# Patient Record
Sex: Male | Born: 1970 | Race: Black or African American | Hispanic: No | Smoking: Current every day smoker
Health system: Southern US, Community
[De-identification: ages and names within clinical notes are randomized; demographics above are authoritative.]

## PROBLEM LIST (undated history)

## (undated) DIAGNOSIS — E669 Obesity, unspecified: Secondary | ICD-10-CM

## (undated) DIAGNOSIS — I10 Essential (primary) hypertension: Secondary | ICD-10-CM

## (undated) DIAGNOSIS — G4733 Obstructive sleep apnea (adult) (pediatric): Secondary | ICD-10-CM

## (undated) HISTORY — PX: THROAT SURGERY: SHX803

---

## 2011-05-31 LAB — COMPREHENSIVE METABOLIC PANEL
BUN: 16 mg/dL (ref 7–18)
Bilirubin,Total: 0.5 mg/dL (ref 0.2–1.0)
Chloride: 102 mmol/L (ref 98–107)
Glucose: 127 mg/dL — ABNORMAL HIGH (ref 65–99)
Osmolality: 278 (ref 275–301)
SGOT(AST): 76 U/L — ABNORMAL HIGH (ref 15–37)
Sodium: 138 mmol/L (ref 136–145)

## 2011-05-31 LAB — CBC
HCT: 46.7 % (ref 40.0–52.0)
HGB: 15.5 g/dL (ref 13.0–18.0)
MCH: 29.1 pg (ref 26.0–34.0)
MCHC: 33.2 g/dL (ref 32.0–36.0)
MCV: 88 fL (ref 80–100)
RBC: 5.34 10*6/uL (ref 4.40–5.90)
RDW: 13.5 % (ref 11.5–14.5)
WBC: 8.8 10*3/uL (ref 3.8–10.6)

## 2011-05-31 LAB — CK TOTAL AND CKMB (NOT AT ARMC): CK, Total: 985 U/L — ABNORMAL HIGH (ref 35–232)

## 2011-05-31 LAB — TROPONIN I: Troponin-I: <0.02 ng/mL

## 2011-06-01 ENCOUNTER — Observation Stay: Payer: Self-pay | Admitting: Internal Medicine

## 2011-06-01 LAB — DRUG SCREEN, URINE
Amphetamines, Ur Screen: NEGATIVE (ref ?–1000)
Barbiturates, Ur Screen: NEGATIVE (ref ?–200)
Cannabinoid 50 Ng, Ur ~~LOC~~: NEGATIVE (ref ?–50)
Cocaine Metabolite,Ur ~~LOC~~: NEGATIVE (ref ?–300)
MDMA (Ecstasy)Ur Screen: NEGATIVE (ref ?–500)
Opiate, Ur Screen: NEGATIVE (ref ?–300)
Tricyclic, Ur Screen: NEGATIVE (ref ?–1000)

## 2011-06-01 LAB — APTT
Activated PTT: 25.4 secs (ref 23.6–35.9)
Activated PTT: 93.2 secs — ABNORMAL HIGH (ref 23.6–35.9)

## 2011-06-01 LAB — LIPASE, BLOOD: Lipase: 170 U/L (ref 73–393)

## 2011-06-01 LAB — TROPONIN I: Troponin-I: <0.02 ng/mL

## 2011-06-01 LAB — ETHANOL
Ethanol %: 0.074 % (ref 0.000–0.080)
Ethanol: 74 mg/dL

## 2011-06-01 LAB — CK TOTAL AND CKMB (NOT AT ARMC)
CK, Total: 676 U/L — ABNORMAL HIGH
CK-MB: 5 ng/mL — ABNORMAL HIGH (ref 0.5–3.6)
CK-MB: 4.4 ng/mL — ABNORMAL HIGH

## 2011-06-02 LAB — BASIC METABOLIC PANEL
Anion Gap: 7 (ref 7–16)
BUN: 16 mg/dL (ref 7–18)
Calcium, Total: 8.5 mg/dL (ref 8.5–10.1)
Chloride: 104 mmol/L (ref 98–107)
Creatinine: 1.17 mg/dL (ref 0.60–1.30)
EGFR (African American): >60
EGFR (Non-African Amer.): >60
Osmolality: 282 (ref 275–301)
Sodium: 140 mmol/L (ref 136–145)

## 2011-06-02 LAB — MAGNESIUM: Magnesium: 2.1 mg/dL

## 2011-06-02 LAB — LIPID PANEL
Cholesterol: 202 mg/dL — ABNORMAL HIGH (ref 0–200)
Triglycerides: 530 mg/dL — ABNORMAL HIGH (ref 0–200)

## 2011-06-02 LAB — HEMOGLOBIN A1C: Hemoglobin A1C: 7 % — ABNORMAL HIGH (ref 4.2–6.3)

## 2011-06-02 LAB — PROTIME-INR: INR: 0.9

## 2014-04-17 DIAGNOSIS — I10 Essential (primary) hypertension: Secondary | ICD-10-CM | POA: Insufficient documentation

## 2014-05-10 NOTE — H&P (Signed)
PATIENT NAME:  Ernest PineRAMSEY, Jerad D MR#:  [id number] DATE OF BIRTH:  September 10, 1970  DATE OF ADMISSION:  06/01/2011  REFERRING PHYSICIAN: Dr. Jens SomWiegand PRIMARY CARE PHYSICIAN: Karene FryJason Sonnenschein, PA at [facility]  PRESENTING COMPLAINT: Epigastric pain followed by syncope x2.   HISTORY OF PRESENT ILLNESS: Mr. Reed PandyRamsey is a 44 year old gentleman with history of obesity, hypertension, tobacco abuse who presents today with reports of developing syncope and collapse witnessed by his wife. Reports that he was walking into his house with the first episode when he developed epigastric pain and sensation of discomfort up his chest. He became diaphoretic and syncopized for maybe 10 seconds. Patient came to, he ambulated to his bedroom, had another spell of epigastric pain and syncope also lasting 10 seconds. No convulsions, no bowel or urine incontinence, no confusion when he came to. No prior episodes in the past. Reports that he has been having intermittent coughing spells for about two years now with intermittent productive phlegm, however, no hemoptysis. He denies any chest pain. He actually does not describe the epigastric pain as chest discomfort. He denies any rash or sores, fevers or chills, nausea, or vomiting. Currently denies any chest pain still and no recurrent episodes of syncope since being here.   PAST MEDICAL HISTORY:  1. Hypertension.  2. Obesity.  3. Tobacco abuse.   PAST SURGICAL HISTORY: Benign neck mass resection from his submandibular space it appears.   ALLERGIES: No known drug allergies.   MEDICATIONS:  1. Hydrochlorothiazide 25 mg daily.  2. Lisinopril/HCTZ 20/25 mg daily.  3. Amlodipine 10 mg daily.  4. Metoprolol ER 100 mg daily.   FAMILY HISTORY: Mother had open heart surgery in her mid 5540s. Grandmother had diabetes.   SOCIAL HISTORY: [place] in [place] with his wife and two kids. He smokes a pack per day. He drinks maybe two beers every other night. No history of drug  use.   REVIEW OF SYSTEMS: CONSTITUTIONAL: No fevers, nausea, vomiting. EYES: No visual changes. ENT: No epistaxis, discharge. RESPIRATORY: As per history of present illness with chronic cough. No wheezing or hemoptysis. CARDIOVASCULAR: As per history of present illness. Denies any lower extremity edema, palpitations, or arrhythmia. GASTROINTESTINAL: No nausea, vomiting, diarrhea. Reports epigastric pain as per history of present illness. No hematemesis or melena. GENITOURINARY: No dysuria, hematuria. ENDO: No polyuria, polydipsia. HEMATOLOGIC: No easy bleeding. SKIN: No ulcers. MUSCULOSKELETAL: No joint pain or swelling. NEUROLOGIC: No history of strokes or seizures. PSYCH: Denies any depression or suicidal ideation.   PHYSICAL EXAMINATION:  VITAL SIGNS: Temperature 98.6, pulse 92, respiratory rate 20, blood pressure 105/55, sating at 99% on room air.   GENERAL: Lying in bed, obese in no apparent distress.   HEENT: Normocephalic, atraumatic. Pupils equal, symmetric, nonicteric. Nares without discharge. Moist mucous membrane.   NECK: Soft and supple. No adenopathy or JVP.   CARDIOVASCULAR: Non-tachy. No murmurs, rubs, or gallops.   LUNGS: Clear to auscultation bilaterally. No use of accessory muscles or increased respiratory effort.   ABDOMEN: Soft. Positive bowel sounds. No mass appreciated. No pain in the epigastric region on palpation.   EXTREMITIES: No edema. Dorsal pedis pulses intact.   MUSCULOSKELETAL: No joint effusion.   SKIN: No ulcers.   NEUROLOGIC: No dysarthria, aphasia. Symmetrical strength. No focal deficits.   PSYCH: He is alert and oriented. Patient is cooperative.   LABORATORY, DIAGNOSTIC AND RADIOLOGICAL DATA: CK 985, MB 5.3. Troponin less than 0.02. Glucose 127, BUN 16, creatinine 1.34, sodium 138, potassium 3.2, chloride 102, carbon  dioxide 23, calcium 8.5, total bilirubin 0.5, alkaline phosphatase 53, ALT 59, AST 76, total protein 7.4, albumin 3.6, WBC 8.8,  hemoglobin 15.5, hematocrit 46.7, platelet 220, MCV 88. Chest x-ray without any acute findings. EKG with sinus rate of 92. There is T wave inversion in 1, 2, AVL leads also V4 to V6.   ASSESSMENT AND PLAN: Mr. Fotheringham is a 44 year old gentleman with history of tobacco use, hypertension, obesity, family history of early cardiac disease presenting status post epigastric pain and syncope x2. 1. Syncope. Questionable cardiac etiology. CK and MB elevated. The first troponin is negative. An EKG with T wave inversions in inferolateral lead. Continue on tele. Cycle cardiac enzymes. Will start on nitroglycerin sublingual as needed. Resume his metoprolol XL. Start on Lipitor, aspirin, oxygen and morphine as needed. Obtain orthostatics daily. Will order for a Myoview and an echo for now. Will obtain cardiology consultation but given his story and history questionable benefits from pursuing catheterization instead.   2. Epigastric pain. Could be anginal equivalent. His AST elevation is likely in the setting of alcohol use. Will send EtOH level and urine drug screen. Will also get abdominal ultrasound as the abdominal pain could be a primary issue that caused a vagal response.  3. Renal insufficiency. Hold hydrochlorothiazide and lisinopril. Continue IV fluids. Follow daily creatinine.  4. Tobacco abuse. Encouraged smoking cessation and start on nicotine patch.  5. Hypertension with relative low normal pressures. Resume beta blocker, but hold lisinopril, HCTZ and amlodipine for now. 6. Hypokalemia. As above, holding HCTZ.  7. Prophylaxis with heparin sub-Q, aspirin and Protonix.   TIME SPENT: Approximately 50 minutes on patient care.  ____________________________ Reuel Derby, MD ap:cms D: 06/01/2011 04:21:43 ET T: 06/01/2011 09:33:30 ET  JOB#: [id number] cc: Karene Fry, PA at [facility] MD ELECTRONICALLY SIGNED 06/14/2011 22:36

## 2014-05-10 NOTE — Consult Note (Signed)
Brief Consult Note: Diagnosis: Syncope, probable neurocardiogenic.   Patient was seen by consultant.   Consult note dictated.   Comments: REC  Agree with current therapy, review echo, ETT-sest in am.  Electronic Signatures: Marcina MillardParaschos, Adreana Coull (MD)  (Signed 16-May-13 16:09)  Authored: Brief Consult Note   Last Updated: 16-May-13 16:09 by Marcina MillardParaschos, Tishina Lown (MD)

## 2014-05-10 NOTE — Discharge Summary (Signed)
PATIENT NAME:  Rodney Hess, [name] MR#:  [id number] DATE OF BIRTH:  Nov 01, 1970  DATE OF ADMISSION:  06/01/2011 DATE OF DISCHARGE:  06/02/2011  ADMITTING DIAGNOSIS: Epigastric pain followed by syncope with complaints of lower chest pain.   DISCHARGE DIAGNOSES:  1. Syncope, possibly vasovagal in nature.  2. Acute renal failure, resolved with intravenous hydration.  3. Mild rhabdomyolysis, likely as a result of possible fall from his syncope. His CPK was trending downwards.  4. Vague chest pain, felt to be atypical, status post stress test which showed no evidence of ischemia or infarct. Left ventricular function by gated acquisition 43. No evidence of reversible ischemia. Carotid Doppler's showed no significant stenosis. EKG showed normal sinus rhythm, nonspecific T wave changes. Chest x-ray on presentation was no acute abnormality. Hemoglobin A1c was 7.  5. Hypertriglyceridemia. Cholesterol 202, triglycerides 530, HDL 22. TSH 1.98.   HOSPITAL COURSE: Please see history and physical done by the admitting physician. The patient is a 44 year old African American male with hypertension and tobacco abuse who presented with complaint of passing out x2 episodes. He stated that he had some epigastric pain as well as some discomfort of his chest. He became diaphoretic and passed out for 10 seconds. Due to the patient having two of these episodes, we were asked to admit the patient. The patient in the ED was noted to be a little dehydrated. He was admitted and underwent further evaluation for his syncope. The patient had no evidence of any arrhythmia overnight on telemetry. His carotid Doppler's were negative. An echocardiogram report is currently pending. He also underwent stress test because of this chest pain. The stress test was negative. He was noted to have hemoglobin A1c suggestive of diabetes. The patient counseled regarding diabetes, how to check his sugars, etc. He will be discharged on metformin. He was  also noted to have hypertriglyceridemia, which will be treated. At this time, he is feeling much better and is stable for discharge.   DISCHARGE MEDICATIONS:  1. Metoprolol succinate 100 mg daily.  2. Amlodipine 10 daily.  3. Lisinopril 20 daily.  4. Aspirin 81, one tab daily. 5. Metformin 500 mg p.o. b.i.d.  6. TriCor 145 p.o. daily.   HOME O2: None.   DIET: Low sodium, low fat.   TIMEFRAME FOR FOLLOW UP: 1 to 2 weeks with Karene FryJason Sonnenschein, his primary care provider. The patient is to keep a log of blood sugar to take to his primary M.D. I also recommended he exercise and follow the diet as given to him.   TIME SPENT: 35 minutes.     ____________________________ Lacie ScottsShreyang H. Allena KatzPatel, MD shp:ap D: 06/02/2011 16:31:38 ET T: 06/03/2011 15:00:33 ET JOB#: [id number]  cc: Jhan Conery H. Allena KatzPatel, MD, <Dictator> Karene FryJason Sonnenschein, [place]  Serita GritSHREYANG Molinda BailiffH Lateisha Thurlow MD ELECTRONICALLY SIGNED 06/10/2011 19:08

## 2014-05-10 NOTE — Consult Note (Signed)
PATIENT NAME:  Rodney Hess, [name] MR#:  [id number] DATE OF BIRTH:  [birth year]  DATE OF CONSULTATION:  06/01/2011  REFERRING PHYSICIAN:  Alounthith Phichith, MD CONSULTING PHYSICIAN:  Marcina MillardAlexander Annleigh Knueppel, MD  CHIEF COMPLAINT:  I passed out.   HISTORY OF PRESENT ILLNESS: The patient is a 44 year old male referred for evaluation of syncope. The patient has a history of hypertension. He reports that he was in his usual state of health until 06/01/2011 when he developed a flushing sensation followed by mid epigastric discomfort and had a brief loss of consciousness in his front yard witnessed by his wife. [facility] was [facility] where EKG revealed sinus rhythm with T wave inversion inferolaterally. The patient denies chest pain or shortness of breath. The patient has ruled out for myocardial infarction by troponin.   PAST MEDICAL HISTORY:  1. Hypertension.  2. Obesity.  3. Tobacco abuse.   MEDICATIONS:  1. Metoprolol ER 100 mg daily.  2. Amlodipine 10 mg daily.  3. Lisinopril/HCTZ 20/25 daily.  4. HCTZ 25 mg daily.   SOCIAL HISTORY: The patient is married. He lives with his wife. He smokes a pack of cigarettes a day. He drinks approximately two beers every other night.   FAMILY HISTORY: Positive for coronary artery disease. Mother is status post bypass graft surgery in her 3940s.   REVIEW OF SYSTEMS: CONSTITUTIONAL: No fever or chills. EYES: No vision changes. EARS: No hearing loss. RESPIRATORY: No shortness of breath. CARDIOVASCULAR: No chest pain. GASTROINTESTINAL: Nausea as described yesterday. GU: No dysuria or hematuria. ENDOCRINE: No polyuria or polydipsia. HEMATOLOGICAL: No easy bruising or bleeding. MUSCULOSKELETAL: No arthralgias or myalgias. NEUROLOGICAL: No focal muscle weakness or numbness.   PSYCHOLOGICAL: No depression or anxiety.   PHYSICAL EXAMINATION:  VITAL SIGNS: Blood pressure 153/99, pulse 69, respirations 20, temperature 98.4, pulse  oximetry 93%.   HEENT: Pupils equal and reactive to light and accommodation.   NECK: Supple without thyromegaly.   LUNGS: Clear.   HEART: Normal jugular venous pressure. Normal point of maximal impulse. Regular rate and rhythm. Normal S1, S2. No appreciable gallop, murmur, or rub.   ABDOMEN: Soft and nontender. Pulses were intact bilaterally.   MUSCULOSKELETAL: Normal muscle tone.   NEUROLOGIC: The patient is alert and oriented x3. Motor and sensory both grossly intact.   IMPRESSION: A 44 year old gentleman who presents after a syncopal episode with features consistent with vasovagal syncope with mid epigastric discomfort, diaphoresis and brief loss of consciousness. EKG is abnormal at baseline likely reflective of hypertension. The patient has ruled out for myocardial infarction by troponin.   RECOMMENDATIONS:  1. Agree with overall current therapy.  2. Review 2-D echocardiogram.  3. Reasonable to proceed with functional study.  4. If no evidence for ischemia, no further work-up required at this time. ____________________________ Marcina MillardAlexander Takirah Binford, MD ap:ap D: 06/01/2011 16:08:23 ET            T: 06/02/2011 07:58:19 ET                   JOB#: [id number] cc: Marcina MillardAlexander Anu Stagner, MD, <Dictator> Marcina MillardALEXANDER Xavyer Steenson MD ELECTRONICALLY SIGNED 06/29/2011 17:39

## 2014-10-03 ENCOUNTER — Inpatient Hospital Stay
Admission: EM | Admit: 2014-10-03 | Discharge: 2014-10-04 | DRG: 153 | Disposition: A | Discharge status: Home / Self Care | Payer: BLUE CROSS/BLUE SHIELD | Attending: Internal Medicine | Admitting: Internal Medicine

## 2014-10-03 ENCOUNTER — Emergency Department: Payer: BLUE CROSS/BLUE SHIELD

## 2014-10-03 ENCOUNTER — Encounter: Payer: Self-pay | Admitting: Emergency Medicine

## 2014-10-03 ENCOUNTER — Other Ambulatory Visit: Payer: Self-pay

## 2014-10-03 DIAGNOSIS — Z6841 Body Mass Index (BMI) 40.0 and over, adult: Secondary | ICD-10-CM

## 2014-10-03 DIAGNOSIS — M6282 Rhabdomyolysis: Secondary | ICD-10-CM | POA: Diagnosis present

## 2014-10-03 DIAGNOSIS — E860 Dehydration: Secondary | ICD-10-CM

## 2014-10-03 DIAGNOSIS — G4733 Obstructive sleep apnea (adult) (pediatric): Secondary | ICD-10-CM | POA: Diagnosis present

## 2014-10-03 DIAGNOSIS — J029 Acute pharyngitis, unspecified: Secondary | ICD-10-CM | POA: Diagnosis present

## 2014-10-03 DIAGNOSIS — N179 Acute kidney failure, unspecified: Secondary | ICD-10-CM

## 2014-10-03 DIAGNOSIS — I10 Essential (primary) hypertension: Secondary | ICD-10-CM | POA: Diagnosis present

## 2014-10-03 DIAGNOSIS — R1310 Dysphagia, unspecified: Secondary | ICD-10-CM | POA: Diagnosis present

## 2014-10-03 DIAGNOSIS — F17210 Nicotine dependence, cigarettes, uncomplicated: Secondary | ICD-10-CM | POA: Diagnosis present

## 2014-10-03 HISTORY — DX: Obstructive sleep apnea (adult) (pediatric): G47.33

## 2014-10-03 HISTORY — DX: Essential (primary) hypertension: I10

## 2014-10-03 HISTORY — DX: Obesity, unspecified: E66.9

## 2014-10-03 LAB — CBC WITH DIFFERENTIAL/PLATELET
BASOS ABS: 0.2 10*3/uL — AB (ref 0–0.1)
Basophils Relative: 2 %
EOS ABS: 0.1 10*3/uL (ref 0–0.7)
EOS PCT: 1 %
HCT: 44.1 % (ref 40.0–52.0)
HEMOGLOBIN: 14.4 g/dL (ref 13.0–18.0)
LYMPHS ABS: 0.5 10*3/uL — AB (ref 1.0–3.6)
LYMPHS PCT: 6 %
MCH: 27.9 pg (ref 26.0–34.0)
MCHC: 32.6 g/dL (ref 32.0–36.0)
MCV: 85.4 fL (ref 80.0–100.0)
Monocytes Absolute: 0.8 10*3/uL (ref 0.2–1.0)
Monocytes Relative: 9 %
NEUTROS PCT: 82 %
Neutro Abs: 7.1 10*3/uL — ABNORMAL HIGH (ref 1.4–6.5)
PLATELETS: 186 10*3/uL (ref 150–440)
RBC: 5.17 MIL/uL (ref 4.40–5.90)
RDW: 13.9 % (ref 11.5–14.5)
WBC: 8.8 10*3/uL (ref 3.8–10.6)

## 2014-10-03 LAB — URINALYSIS COMPLETE WITH MICROSCOPIC (ARMC ONLY)
BILIRUBIN URINE: NEGATIVE
Bacteria, UA: NONE SEEN
GLUCOSE, UA: NEGATIVE mg/dL
KETONES UR: NEGATIVE mg/dL
LEUKOCYTES UA: NEGATIVE
NITRITE: NEGATIVE
Protein, ur: NEGATIVE mg/dL
SPECIFIC GRAVITY, URINE: 1.042 — AB (ref 1.005–1.030)
Squamous Epithelial / LPF: NONE SEEN
pH: 5 (ref 5.0–8.0)

## 2014-10-03 LAB — COMPREHENSIVE METABOLIC PANEL
ALT: 38 U/L (ref 17–63)
AST: 47 U/L — AB (ref 15–41)
Albumin: 3.9 g/dL (ref 3.5–5.0)
Alkaline Phosphatase: 46 U/L (ref 38–126)
Anion gap: 10 (ref 5–15)
BILIRUBIN TOTAL: 0.8 mg/dL (ref 0.3–1.2)
BUN: 18 mg/dL (ref 6–20)
CHLORIDE: 100 mmol/L — AB (ref 101–111)
CO2: 27 mmol/L (ref 22–32)
Calcium: 8.8 mg/dL — ABNORMAL LOW (ref 8.9–10.3)
Creatinine, Ser: 1.68 mg/dL — ABNORMAL HIGH (ref 0.61–1.24)
GFR, EST AFRICAN AMERICAN: 56 mL/min — AB (ref >60)
GFR, EST NON AFRICAN AMERICAN: 48 mL/min — AB (ref >60)
Glucose, Bld: 221 mg/dL — ABNORMAL HIGH (ref 65–99)
POTASSIUM: 3.7 mmol/L (ref 3.5–5.1)
Sodium: 137 mmol/L (ref 135–145)
TOTAL PROTEIN: 7.2 g/dL (ref 6.5–8.1)

## 2014-10-03 LAB — CK: Total CK: 424 U/L — ABNORMAL HIGH (ref 49–397)

## 2014-10-03 LAB — BRAIN NATRIURETIC PEPTIDE: B Natriuretic Peptide: 260 pg/mL — ABNORMAL HIGH (ref 0.0–100.0)

## 2014-10-03 LAB — TROPONIN I
TROPONIN I: 0.05 ng/mL — AB (ref <0.031)
Troponin I: 0.05 ng/mL — ABNORMAL HIGH (ref <0.031)
Troponin I: 0.04 ng/mL — ABNORMAL HIGH (ref <0.031)

## 2014-10-03 MED ORDER — SODIUM CHLORIDE 0.9 % IV BOLUS (SEPSIS)
1000.0000 mL | Freq: Once | INTRAVENOUS | Status: AC
  Administered 2014-10-03: 1000 mL via INTRAVENOUS

## 2014-10-03 MED ORDER — SODIUM CHLORIDE 0.9 % IJ SOLN
3.0000 mL | Freq: Two times a day (BID) | INTRAMUSCULAR | Status: DC
  Administered 2014-10-03: 3 mL via INTRAVENOUS

## 2014-10-03 MED ORDER — IOHEXOL 300 MG/ML  SOLN
75.0000 mL | Freq: Once | INTRAMUSCULAR | Status: AC | PRN
  Administered 2014-10-03: 75 mL via INTRAVENOUS

## 2014-10-03 MED ORDER — HYDRALAZINE HCL 20 MG/ML IJ SOLN: 10.0000 mg | Freq: Four times a day (QID) | INTRAMUSCULAR | Status: DC | PRN

## 2014-10-03 MED ORDER — DEXAMETHASONE 4 MG PO TABS
4.0000 mg | ORAL_TABLET | Freq: Three times a day (TID) | ORAL | Status: DC
  Administered 2014-10-03 – 2014-10-04 (×2): 4 mg via ORAL
  Filled 2014-10-03 (×5): qty 1

## 2014-10-03 MED ORDER — OXYCODONE HCL 5 MG PO TABS
5.0000 mg | ORAL_TABLET | ORAL | Status: DC | PRN
  Administered 2014-10-03: 5 mg via ORAL
  Filled 2014-10-03: qty 1

## 2014-10-03 MED ORDER — AMLODIPINE BESYLATE 10 MG PO TABS
10.0000 mg | ORAL_TABLET | Freq: Every day | ORAL | Status: DC
  Administered 2014-10-03 – 2014-10-04 (×2): 10 mg via ORAL
  Filled 2014-10-03: qty 1
  Filled 2014-10-03: qty 2

## 2014-10-03 MED ORDER — ACETAMINOPHEN 325 MG PO TABS: 650.0000 mg | ORAL_TABLET | Freq: Four times a day (QID) | ORAL | Status: DC | PRN

## 2014-10-03 MED ORDER — SODIUM CHLORIDE 0.9 % IV SOLN
3.0000 g | Freq: Four times a day (QID) | INTRAVENOUS | Status: DC
  Administered 2014-10-03 – 2014-10-04 (×3): 3 g via INTRAVENOUS
  Filled 2014-10-03 (×7): qty 3

## 2014-10-03 MED ORDER — INFLUENZA VAC SPLIT QUAD 0.5 ML IM SUSY: 0.5000 mL | PREFILLED_SYRINGE | INTRAMUSCULAR | Status: DC

## 2014-10-03 MED ORDER — CLINDAMYCIN PHOSPHATE 600 MG/50ML IV SOLN
600.0000 mg | Freq: Once | INTRAVENOUS | Status: AC
  Administered 2014-10-03: 600 mg via INTRAVENOUS
  Filled 2014-10-03: qty 50

## 2014-10-03 MED ORDER — ASPIRIN 81 MG PO CHEW
324.0000 mg | CHEWABLE_TABLET | Freq: Once | ORAL | Status: AC
  Administered 2014-10-03: 324 mg via ORAL
  Filled 2014-10-03: qty 4

## 2014-10-03 MED ORDER — ENOXAPARIN SODIUM 40 MG/0.4ML ~~LOC~~ SOLN
40.0000 mg | SUBCUTANEOUS | Status: DC
  Administered 2014-10-03: 40 mg via SUBCUTANEOUS
  Filled 2014-10-03: qty 0.4

## 2014-10-03 MED ORDER — DEXAMETHASONE SODIUM PHOSPHATE 10 MG/ML IJ SOLN
10.0000 mg | Freq: Once | INTRAMUSCULAR | Status: AC
  Administered 2014-10-03: 10 mg via INTRAVENOUS
  Filled 2014-10-03: qty 1

## 2014-10-03 MED ORDER — SODIUM CHLORIDE 0.9 % IV SOLN
INTRAVENOUS | Status: DC
  Administered 2014-10-03: 21:00:00 via INTRAVENOUS

## 2014-10-03 MED ORDER — ONDANSETRON HCL 4 MG PO TABS: 4.0000 mg | ORAL_TABLET | Freq: Four times a day (QID) | ORAL | Status: DC | PRN

## 2014-10-03 MED ORDER — ACETAMINOPHEN 650 MG RE SUPP: 650.0000 mg | Freq: Four times a day (QID) | RECTAL | Status: DC | PRN

## 2014-10-03 MED ORDER — ONDANSETRON HCL 4 MG/2ML IJ SOLN
4.0000 mg | Freq: Four times a day (QID) | INTRAMUSCULAR | Status: DC | PRN
  Administered 2014-10-03: 4 mg via INTRAVENOUS
  Filled 2014-10-03: qty 2

## 2014-10-03 NOTE — ED Notes (Signed)
Pt states his muscles in his arms/legs feel like he has been lifting weights, tingling since he woke up yesterday morning, has been able to walk. C/o sore throat, states its hard to swallow. Pt is diaphoretic. Sats 89-91% on room air, denies asthma/allergies/copd.

## 2014-10-03 NOTE — Consult Note (Signed)
Rodney Hess, Rodney Hess [id number] 07-02-1970 Rodney Nearing, MD  Reason for Consult: Sore throat Requesting Physician: Gladstone Lighter, MD Consulting Physician: Rodney Hess  HPI: This 44 y.o. year old male was admitted on 10/03/2014 for body tingling,sweating. For the past 24 hours or so he has had some low-grade fever, sore throat, nasal congestion, postnasal drainage, and some difficulty swallowing. He points to the level of his larynx were the throat was irritated. He also was having body aches and there is concern for rhabdomyolysis. He has not had any neck pain or stiffness. He was given clindamycin in the emergency room and now he says his throat feels much better, he is hungry and wants to eat. He denies any hoarseness or difficulty breathing.  Allergies: No Known Allergies  Medications:  (Not in a hospital admission). Current Facility-Administered Medications  Medication Dose Route Frequency Provider Last Rate Last Dose  . amLODipine (NORVASC) tablet 10 mg  10 mg Oral Daily Gladstone Lighter, MD   10 mg at 10/03/14 1901  . hydrALAZINE (APRESOLINE) injection 10 mg  10 mg Intravenous Q6H PRN Gladstone Lighter, MD       Current Outpatient Prescriptions  Medication Sig Dispense Refill  . amLODipine (NORVASC) 5 MG tablet Take 5 mg by mouth daily.    . hydrochlorothiazide (HYDRODIURIL) 12.5 MG tablet Take 12.5 mg by mouth every morning.      PMH:  Past Medical History  Diagnosis Date  . Hypertension   . OSA (obstructive sleep apnea)   . Obesity     Fam Hx:  Family History  Problem Relation Age of Onset  . CAD Mother     Soc Hx:  Social History   Social History  . Marital Status: Married    Spouse Name: N/A  . Number of Children: N/A  . Years of Education: N/A   Occupational History  . Not on file.   Social History Main Topics  . Smoking status: Current Every Day Smoker -- 1.00 packs/day    Types: Cigarettes    Last Attempt to Quit: 10/01/2014  . Smokeless  tobacco: Not on file  . Alcohol Use: 0.0 oz/week    0 Standard drinks or equivalent per week     Comment: daily, mostly beer  . Drug Use: No  . Sexual Activity: Not on file   Other Topics Concern  . Not on file   Social History Narrative   Lives at home with family.    PSH:  Past Surgical History  Procedure Laterality Date  . Throat surgery    . Procedures since admission: No admission procedures for hospital encounter.  ROS: Review of systems normal other than 12 systems except per HPI.  PHYSICAL EXAM Vitals:  Filed Vitals:   10/03/14 1900  BP: 155/87  Pulse:   Temp:   Resp: 23  . General: Well-developed, Well-nourished obese male, in no acute distress Mood: Mood and affect well adjusted, pleasant and cooperative. Orientation: Grossly alert and oriented. Vocal Quality: No hoarseness. Communicates verbally. No stridor. head and Face: NCAT. No facial asymmetry. No visible skin lesions. No significant facial scars. No tenderness with sinus percussion. Facial strength normal and symmetric. Ears: External ears with normal landmarks, no lesions. External auditory canals free of infection, cerumen impaction or lesions. Tympanic membranes intact with good landmarks and normal mobility on pneumatic otoscopy. No middle ear effusion. Hearing: Speech reception grossly normal. Nose: External nose normal with midline dorsum and no lesions or deformity. Nasal Cavity reveals essentially midline  septum with some mucosal edema of the turbinates, mild erythema and clear secretions. No gross purulence is seen. No polyps seen on anterior rhinoscopy. Oral Cavity/ Oropharynx: Lips are normal with no lesions. Teeth no frank dental caries. Gingiva healthy with no lesions or gingivitis. Oropharynx including tongue, buccal mucosa, floor of mouth, hard and soft palate, uvula and posterior pharynx are normal except for some mild nonspecific erythema of the posterior pharynx without evidence of abscess or  any mass effect in the posterior pharyngeal wall.  Indirect Laryngoscopy/Nasopharyngoscopy: Visualization of the larynx, hypopharynx and nasopharynx is not possible in this setting with routine examination. Neck: Supple and symmetric with no palpable masses, tenderness or crepitance. There is no neck stiffness at all. The trachea is midline. Thyroid gland is soft, nontender and symmetric with no masses or enlargement. Parotid and submandibular glands are soft, nontender and symmetric, without masses. Lymphatic: Cervical lymph nodes are without palpable lymphadenopathy or tenderness. Respiratory: Normal respiratory effort without labored breathing. Cardiovascular: Carotid pulse shows regular rate and rhythm Neurologic: Cranial Nerves II through XII are grossly intact. Eyes: Gaze and Ocular Motility are grossly normal. PERRLA. No visible nystagmus.  MEDICAL DECISION MAKING: Data Review:  Results for orders placed or performed during the hospital encounter of 10/03/14 (from the past 48 hour(s))  Comprehensive metabolic panel     Status: Abnormal   Collection Time: 10/03/14  2:35 PM  Result Value Ref Range   Sodium 137 135 - 145 mmol/L   Potassium 3.7 3.5 - 5.1 mmol/L   Chloride 100 (L) 101 - 111 mmol/L   CO2 27 22 - 32 mmol/L   Glucose, Bld 221 (H) 65 - 99 mg/dL   BUN 18 6 - 20 mg/dL   Creatinine, Ser 1.68 (H) 0.61 - 1.24 mg/dL   Calcium 8.8 (L) 8.9 - 10.3 mg/dL   Total Protein 7.2 6.5 - 8.1 g/dL   Albumin 3.9 3.5 - 5.0 g/dL   AST 47 (H) 15 - 41 U/L   ALT 38 17 - 63 U/L   Alkaline Phosphatase 46 38 - 126 U/L   Total Bilirubin 0.8 0.3 - 1.2 mg/dL   GFR calc non Af Amer 48 (L) >60 mL/min   GFR calc Af Amer 56 (L) >60 mL/min    Comment: (NOTE) The eGFR has been calculated using the CKD EPI equation. This calculation has not been validated in all clinical situations. eGFR's persistently <60 mL/min signify possible Chronic Kidney Disease.    Anion gap 10 5 - 15  CBC WITH DIFFERENTIAL      Status: Abnormal   Collection Time: 10/03/14  2:35 PM  Result Value Ref Range   WBC 8.8 3.8 - 10.6 K/uL   RBC 5.17 4.40 - 5.90 MIL/uL   Hemoglobin 14.4 13.0 - 18.0 g/dL   HCT 44.1 40.0 - 52.0 %   MCV 85.4 80.0 - 100.0 fL   MCH 27.9 26.0 - 34.0 pg   MCHC 32.6 32.0 - 36.0 g/dL   RDW 13.9 11.5 - 14.5 %   Platelets 186 150 - 440 K/uL   Neutrophils Relative % 82 %   Neutro Abs 7.1 (H) 1.4 - 6.5 K/uL   Lymphocytes Relative 6 %   Lymphs Abs 0.5 (L) 1.0 - 3.6 K/uL   Monocytes Relative 9 %   Monocytes Absolute 0.8 0.2 - 1.0 K/uL   Eosinophils Relative 1 %   Eosinophils Absolute 0.1 0 - 0.7 K/uL   Basophils Relative 2 %   Basophils Absolute 0.2 (  H) 0 - 0.1 K/uL  Troponin I     Status: Abnormal   Collection Time: 10/03/14  2:35 PM  Result Value Ref Range   Troponin I 0.05 (H) <0.031 ng/mL    Comment: READ BACK AND VERIFIED WITH JANE RYAN @ 1526 ON 10/03/2014 BY [facility]        PERSISTENTLY INCREASED TROPONIN VALUES IN THE RANGE OF 0.04-0.49 ng/mL CAN BE SEEN IN:       -UNSTABLE ANGINA       -CONGESTIVE HEART FAILURE       -MYOCARDITIS       -CHEST TRAUMA       -ARRYHTHMIAS       -LATE PRESENTING MYOCARDIAL INFARCTION       -COPD   CLINICAL FOLLOW-UP RECOMMENDED.   CK     Status: Abnormal   Collection Time: 10/03/14  2:35 PM  Result Value Ref Range   Total CK 424 (H) 49 - 397 U/L  Urinalysis complete, with microscopic ([facility] only)     Status: Abnormal   Collection Time: 10/03/14  4:48 PM  Result Value Ref Range   Color, Urine YELLOW (A) YELLOW   APPearance CLEAR (A) CLEAR   Glucose, UA NEGATIVE NEGATIVE mg/dL   Bilirubin Urine NEGATIVE NEGATIVE   Ketones, ur NEGATIVE NEGATIVE mg/dL   Specific Gravity, Urine 1.042 (H) 1.005 - 1.030   Hgb urine dipstick 1+ (A) NEGATIVE   pH 5.0 5.0 - 8.0   Protein, ur NEGATIVE NEGATIVE mg/dL   Nitrite NEGATIVE NEGATIVE   Leukocytes, UA NEGATIVE NEGATIVE   RBC / HPF 0-5 0 - 5 RBC/hpf   WBC, UA 0-5 0 - 5 WBC/hpf   Bacteria, UA NONE SEEN NONE  SEEN   Squamous Epithelial / LPF NONE SEEN NONE SEEN   Mucous PRESENT    Hyaline Casts, UA PRESENT   Brain natriuretic peptide     Status: Abnormal   Collection Time: 10/03/14  4:48 PM  Result Value Ref Range   B Natriuretic Peptide 260.0 (H) 0.0 - 100.0 pg/mL  Troponin I     Status: Abnormal   Collection Time: 10/03/14  4:48 PM  Result Value Ref Range   Troponin I 0.05 (H) <0.031 ng/mL    Comment: RESULTS PREVIOUSLY CALLED BY [facility] ON 10/03/2014 @ 1526 [facility]        PERSISTENTLY INCREASED TROPONIN VALUES IN THE RANGE OF 0.04-0.49 ng/mL CAN BE SEEN IN:       -UNSTABLE ANGINA       -CONGESTIVE HEART FAILURE       -MYOCARDITIS       -CHEST TRAUMA       -ARRYHTHMIAS       -LATE PRESENTING MYOCARDIAL INFARCTION       -COPD   CLINICAL FOLLOW-UP RECOMMENDED.   . Dg Chest 2 View  10/03/2014   CLINICAL DATA:  Smoker. Pt states his muscles in his arms/legs feel like he has been lifting weights, tingling since he woke up yesterday morning, has been able to walk. C/o sore throat, states its hard to swallow. Pt is diaphoretic. Sats 89-91%  EXAM: CHEST  2 VIEW  COMPARISON:  05/31/2011  FINDINGS: The heart size and mediastinal contours are within normal limits. Both lungs are clear. No pleural effusion or pneumothorax. Bony thorax is intact.  IMPRESSION: No active cardiopulmonary disease.   Electronically Signed   By: Lajean Manes M.D.   On: 10/03/2014 15:07   Ct Soft Tissue Neck W Contrast  10/03/2014  CLINICAL DATA:  Pt states his muscles in his arms/legs feel like he has been lifting weights, tingling since he woke up yesterday morning, has been able to walk. C/o sore throat, states its hard to swallow. Pt is diaphoretic. Sats 89-91% on room air, denies asthma/allergies/copd.  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  EXAM: CT NECK WITH CONTRAST  TECHNIQUE: Multidetector CT imaging of the neck was performed using the standard protocol following the bolus administration of intravenous  contrast.  CONTRAST:  94m OMNIPAQUE IOHEXOL 300 MG/ML  SOLN  COMPARISON:  None.  FINDINGS: Pharynx and larynx: There is low-density soft tissue fullness posterior to the oral pharynx extending to the larynx. This has Hounsfield units averaging 6. There is no discrete mass. No significant mucosal asymmetry. Laryngeal airway and trachea are widely patent.  Salivary glands: Unremarkable.  Thyroid: Normal.  Lymph nodes: There are scattered prominent lymph nodes mostly in the submandibular region, largest measuring 11 mm in short axis, likely all reactive.  Vascular: The internal carotid arteries are tortuous, swinging posterior to the oral pharynx. A single focus of calcifications noted in the left carotid bulb.  Limited intracranial: Unremarkable  Visualized orbits: Unremarkable  Mastoids and visualized paranasal sinuses: Mucous retention cysts partly opacifies the left maxillary sinus. Remaining visualized sinuses are clear. Clear mastoid air cells and middle ear cavities.  Skeleton: Extensive dental disease with multiple areas of periapical lucency involving both maxillary and mandibular teeth. No fracture. No osteoblastic or osteolytic lesions.  Upper chest: Minor dependent subsegmental atelectasis in the upper lungs. Mild emphysema noted in the upper lobes. No upper mediastinal mass or adenopathy.  IMPRESSION: 1. There is low attenuation material in the retropharyngeal space which may reflect edema. It could potentially reflect an abscess if there are consistent clinical symptoms. The etiology of this is unclear. 2. No masses.  No significant adenopathy.  Airway is widely patent. 3. There is significant dental disease, but no associated soft tissue abscess or inflammation.   Electronically Signed   By: DLajean ManesM.D.   On: 10/03/2014 16:21  .   PROCEDURE: Procedure: Diagnostic Fiberoptic Nasolaryngoscopy Diagnosis: Sore throat, question of retropharyngeal abscess on CT scan Indications: Evaluate  hypopharynx for any evidence of retropharyngeal swelling or mass effect Findings: The nasopharynx hypopharynx larynx and tongue base were unremarkable. There is general crowding of the hypopharynx from prominence of its tongue base in this patient with known sleep apnea, there is no erythema or exudate of the tongue base and specifically there is no visible retropharyngeal edema or mass effect. The vocal cords are clear and mobile. Description of Procedure: After discussing procedure and risks  (primarily nose bleed) with the patient, the nose was anesthetized with topical Lidocaine 4% and decongested with phenylephrine. A flexible fiberoptic scope was passed through the nasal cavity. The nasal cavity was inspected and the scope passed through the Nasopharynx to the region of the hypopharynx and larynx. The patient was instructed to phonate to assess vocal cord mobility. The tongue was extended to evaluate the tongue base completely. Valsalva was performed to insufflate the hypopharynx for improved examination. Findings are as noted above. The scope was withdrawn. The patient tolerated the procedure well.  ASSESSMENT: I have reviewed his CT scan. There is some vague hypodensity in the retropharyngeal area however there is no rim enhancement, and with no white count or neck stiffness and a seemingly rapid response of improvement after a single dose of IV clindamycin and Decadron, the likelihood  of subclinical abscess seems quite remote. The patient is now hungry and wants to eat solid foods.  PLAN: Certainly observation for continued improvement is reasonable, at least overnight and to workup his rhabdomyolysis but I see no evidence of retropharyngeal abscess clinically. If his sore throat and swallowing difficulties worsen again and seemed to progress, a follow-up CT scan could be considered, however he is already responding quite well to antibiotics and steroids. I will sign off on the patient but am happy to  reconsult if necessary.   Rodney Nearing, MD 10/03/2014 7:30 PM

## 2014-10-03 NOTE — H&P (Addendum)
[facility] - [place] at [facility]   PATIENT NAME: Rodney Hess    MR#:  [id number]  DATE OF BIRTH:  [birth year]  DATE OF ADMISSION:  10/03/2014  PRIMARY CARE PHYSICIAN: Dr. Mariana Kaufman  REQUESTING/REFERRING PHYSICIAN: Dr. Ileana Roup  CHIEF COMPLAINT:   Chief Complaint  Patient presents with  . Sore Throat  . Tingling    HISTORY OF PRESENT ILLNESS:  Rodney Hess  is a 44 y.o. obese  male with a known history of hypertension and sleep apnea, not on any CPAP presents to the hospital secondary to myalgias and worsening sore throat or difficulty swallowing. Patient states his complaints started with fever and chills all day yesterday, it is very fatigued. Then started having myalgias. His urine turned dark. Denies any decrease in frequency of urination. He also has significant sore throat with congestion and postnasal drip. He says it's been having trouble swallowing with pain today. Denies any chest pain. Labs here with mild rhabdomyolysis, CT neck with fluid in retropharyngeal space, mildly elevated troponin.  PAST MEDICAL HISTORY:   Past Medical History  Diagnosis Date  . Hypertension   . OSA (obstructive sleep apnea)   . Obesity     PAST SURGICAL HISTORY:   Past Surgical History  Procedure Laterality Date  . Throat surgery      SOCIAL HISTORY:   Social History  Substance Use Topics  . Smoking status: Current Every Day Smoker -- 1.00 packs/day    Types: Cigarettes    Last Attempt to Quit: 10/01/2014  . Smokeless tobacco: Not on file  . Alcohol Use: 0.0 oz/week    0 Standard drinks or equivalent per week     Comment: daily, mostly beer    FAMILY HISTORY:   Family History  Problem Relation Age of Onset  . CAD Mother     DRUG ALLERGIES:  No Known Allergies  REVIEW OF SYSTEMS:   Review of Systems  Constitutional: Positive for chills and malaise/fatigue. Negative for fever and weight loss.  HENT: Positive for sore throat. Negative  for ear discharge, ear pain, hearing loss, nosebleeds and tinnitus.   Eyes: Negative for blurred vision, double vision and photophobia.  Respiratory: Negative for cough, hemoptysis, shortness of breath and wheezing.   Cardiovascular: Negative for chest pain, palpitations, orthopnea and leg swelling.  Gastrointestinal: Negative for heartburn, nausea, vomiting, abdominal pain, diarrhea, constipation and melena.  Genitourinary: Negative for dysuria, urgency, frequency and hematuria.       Dark urine  Musculoskeletal: Positive for myalgias. Negative for back pain and neck pain.  Skin: Negative for rash.  Neurological: Negative for dizziness, tingling, tremors, sensory change, speech change, focal weakness and headaches.  Endo/Heme/Allergies: Does not bruise/bleed easily.  Psychiatric/Behavioral: Negative for depression.    MEDICATIONS AT HOME:   Prior to Admission medications   Medication Sig Start Date End Date Taking? Authorizing Rodney Hess  amLODipine (NORVASC) 5 MG tablet Take 5 mg by mouth daily. 04/17/14 04/17/15 Yes Historical Rodney Toothaker, MD  hydrochlorothiazide (HYDRODIURIL) 12.5 MG tablet Take 12.5 mg by mouth every morning. 04/17/14  Yes Historical Rodney Beachem, MD      VITAL SIGNS:  Blood pressure 141/100, pulse 91, temperature 100 F (37.8 C), temperature source Oral, resp. rate 14, height 5\' 9"  (1.753 m), weight 131.09 kg (289 lb), SpO2 98 %.  PHYSICAL EXAMINATION:   Physical Exam  GENERAL:  44 y.o.-year-old obese patient lying in the bed with no acute distress.  EYES: Pupils equal, round, reactive to light and accommodation.  No scleral icterus. Extraocular muscles intact.  HEENT: Head atraumatic, normocephalic. Oropharynx with some erythema of left tonsillar pillar, edema and uvular deviation to the side and nasopharynx clear.  NECK:  Supple, no jugular venous distention. No thyroid enlargement, no tenderness.  LUNGS: Normal breath sounds bilaterally, no wheezing, rales,rhonchi or  crepitation. No use of accessory muscles of respiration. Decreased bibasilar breath sounds, right base rhonchi CARDIOVASCULAR: S1, S2 normal. No murmurs, rubs, or gallops.  ABDOMEN: Soft, nontender, non distended, obese abdomen. Bowel sounds present. No organomegaly or mass.  EXTREMITIES: No pedal edema, cyanosis, or clubbing.  NEUROLOGIC: Cranial nerves II through XII are intact. Muscle strength 5/5 in all extremities. Sensation intact. Gait not checked.  PSYCHIATRIC: The patient is alert and oriented x 3.  SKIN: No obvious rash, lesion, or ulcer.   LABORATORY PANEL:   CBC  Recent Labs Lab 10/03/14 1435  WBC 8.8  HGB 14.4  HCT 44.1  PLT 186   ------------------------------------------------------------------------------------------------------------------  Chemistries   Recent Labs Lab 10/03/14 1435  NA 137  K 3.7  CL 100*  CO2 27  GLUCOSE 221*  BUN 18  CREATININE 1.68*  CALCIUM 8.8*  AST 47*  ALT 38  ALKPHOS 46  BILITOT 0.8   ------------------------------------------------------------------------------------------------------------------  Cardiac Enzymes  Recent Labs Lab 10/03/14 1648  TROPONINI 0.05*   ------------------------------------------------------------------------------------------------------------------  RADIOLOGY:  Dg Chest 2 View  10/03/2014   CLINICAL DATA:  Smoker. Pt states his muscles in his arms/legs feel like he has been lifting weights, tingling since he woke up yesterday morning, has been able to walk. C/o sore throat, states its hard to swallow. Pt is diaphoretic. Sats 89-91%  EXAM: CHEST  2 VIEW  COMPARISON:  05/31/2011  FINDINGS: The heart size and mediastinal contours are within normal limits. Both lungs are clear. No pleural effusion or pneumothorax. Bony thorax is intact.  IMPRESSION: No active cardiopulmonary disease.   Electronically Signed   By: Rodney Hess M.D.   On: 10/03/2014 15:07   Ct Soft Tissue Neck W  Contrast  10/03/2014   CLINICAL DATA:  Pt states his muscles in his arms/legs feel like he has been lifting weights, tingling since he woke up yesterday morning, has been able to walk. C/o sore throat, states its hard to swallow. Pt is diaphoretic. Sats 89-91% on room air, denies asthma/allergies/copd.  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  EXAM: CT NECK WITH CONTRAST  TECHNIQUE: Multidetector CT imaging of the neck was performed using the standard protocol following the bolus administration of intravenous contrast.  CONTRAST:  75mL OMNIPAQUE IOHEXOL 300 MG/ML  SOLN  COMPARISON:  None.  FINDINGS: Pharynx and larynx: There is low-density soft tissue fullness posterior to the oral pharynx extending to the larynx. This has Hounsfield units averaging 6. There is no discrete mass. No significant mucosal asymmetry. Laryngeal airway and trachea are widely patent.  Salivary glands: Unremarkable.  Thyroid: Normal.  Lymph nodes: There are scattered prominent lymph nodes mostly in the submandibular region, largest measuring 11 mm in short axis, likely all reactive.  Vascular: The internal carotid arteries are tortuous, swinging posterior to the oral pharynx. A single focus of calcifications noted in the left carotid bulb.  Limited intracranial: Unremarkable  Visualized orbits: Unremarkable  Mastoids and visualized paranasal sinuses: Mucous retention cysts partly opacifies the left maxillary sinus. Remaining visualized sinuses are clear. Clear mastoid air cells and middle ear cavities.  Skeleton: Extensive dental disease with multiple areas of periapical lucency involving both maxillary  and mandibular teeth. No fracture. No osteoblastic or osteolytic lesions.  Upper chest: Minor dependent subsegmental atelectasis in the upper lungs. Mild emphysema noted in the upper lobes. No upper mediastinal mass or adenopathy.  IMPRESSION: 1. There is low attenuation material in the retropharyngeal space which may reflect  edema. It could potentially reflect an abscess if there are consistent clinical symptoms. The etiology of this is unclear. 2. No masses.  No significant adenopathy.  Airway is widely patent. 3. There is significant dental disease, but no associated soft tissue abscess or inflammation.   Electronically Signed   By: Rodney Hess M.D.   On: 10/03/2014 16:21    EKG:   Orders placed or performed during the hospital encounter of 10/03/14  . EKG 12-Lead  . EKG 12-Lead  . ED EKG 12-Lead  . ED EKG 12-Lead  . EKG 12-Lead  . EKG 12-Lead    IMPRESSION AND PLAN:   Bijon Mineer  is a 44 y.o. obese  male with a known history of hypertension and sleep apnea, not on any CPAP presents to the hospital secondary to myalgias and worsening sore throat or difficulty swallowing.  #1 sore throat-likely Acute pharyngitis causing early sepsis with fever and tachycardia. -Blood cultures are done. Likely source is throat. Strep throat ordered. -CT of the neck showing edema versus abscess in the retropharyngeal area. -ENT consulted. No compromise to airway. -Started on Decadron by mouth and also Unasyn IV  #2 mild rhabdomyolysis-slightly elevated CPK and also worsening renal function. -Avoid nephrotoxins. Gentle hydration and follow up in a.m.  #3 borderline elevated troponin-no chest pain. No new EKG changes. -Recycle troponins sent off unit telemetry. Less likely to be cardiac event.  #4 hypertension if an elevated blood pressure. Increased Norvasc dose. -With his dehydration hold his hydrochlorothiazide.  #5 DVT prophylaxis-Lovenox   All the records are reviewed and case discussed with ED Jhamari Markowicz. Management plans discussed with the patient, family and they are in agreement.  CODE STATUS: Full code  TOTAL TIME TAKING CARE OF THIS PATIENT: 50 minutes.    Enid Baas M.D on 10/03/2014 at 6:05 PM  Between 7am to 6pm - Pager - [phone]  After 6pm go to [email or web address] - password [email or web address]  [facility]  [facility]  Office  [phone]  CC: Primary care physician; Man Bonneau NOT IN SYSTEM

## 2014-10-03 NOTE — ED Provider Notes (Signed)
[facility] Emergency Department Rodney Hess Note  ____________________________________________  Time seen: Approximately 4:59 PM  I have reviewed the triage vital signs and the nursing notes.   HISTORY  Chief Complaint Sore Throat and Tingling    HPI Rodney MAU Sr. is a 44 y.o. male with a history of line cyst removal from under his chin years ago as well as hypertension, morbid obesity, cigarette abuse which she stopped this week, presents with a sore throat. He states it hurts to swallow and has since yesterday. And then this morning, because he is not taking more by mouth he thinks, he has had diffuse myalgias and muscle aches. This is similar to when he had a few years ago. Patient was admitted a few years ago for rhabdomyolysis of uncertain etiology. At baseline, he has what appears to be normal kidney function. He denies any fever or chills nausea vomiting. He took Aleve declines to endorse chest pain shortness of breath. He has had no stiff neck or meningismus. He is able to open his mouth with no pain. However he has a sore throat. He does not feel that he is having difficulty getting air in. He has had diffuse myalgias and body aches though all day  Past Medical History  Diagnosis Date  . Hypertension     There are no active problems to display for this patient.   Past Surgical History  Procedure Laterality Date  . Throat surgery      No current outpatient prescriptions on file.  Allergies Review of patient's allergies indicates no known allergies.  No family history on file.  Social History Social History  Substance Use Topics  . Smoking status: Former Smoker    Types: Cigarettes    Quit date: 10/01/2014  . Smokeless tobacco: None  . Alcohol Use: Yes     Comment: daily    Review of Systems Constitutional: No fever/chills Eyes: No visual changes. ENT: No sore throat. Cardiovascular: Denies chest pain. Respiratory: Denies  shortness of breath. Gastrointestinal: No abdominal pain.  No nausea, no vomiting.  No diarrhea.  No constipation. Genitourinary: Negative for dysuria. Musculoskeletal: Negative for back pain. Skin: Negative for rash. Neurological: Negative for headaches, focal weakness or numbness. 10-point ROS otherwise negative.  ____________________________________________   PHYSICAL EXAM:  VITAL SIGNS: ED Triage Vitals  Enc Vitals Group     BP 10/03/14 1411 155/95 mmHg     Pulse Rate 10/03/14 1411 113     Resp 10/03/14 1411 22     Temp 10/03/14 1411 100 F (37.8 C)     Temp Source 10/03/14 1411 Oral     SpO2 10/03/14 1411 89 %     Weight 10/03/14 1411 289 lb (131.09 kg)     Height 10/03/14 1411 5\' 9"  (1.753 m)     Head Cir --      Peak Flow --      Pain Score 10/03/14 1412 8     Pain Loc --      Pain Edu? --      Excl. in GC? --     Constitutional: Alert and oriented. Well appearing and in no acute distress. Eyes: Conjunctivae are normal. PERRL. EOMI. Head: Atraumatic. Nose: No congestion/rhinnorhea. Mouth/Throat: Mucous membranes are moist.  Oropharynx only edematous, there is no trismus, he has no hoarse voice, he has no sonorous respirations or stridor. He do not palpate lymphadenopathy Neck: No stridor.  Full painless range of motion, no masses palpated Cardiovascular: Normal rate,  regular rhythm. Grossly normal heart sounds.  Good peripheral circulation. Respiratory: Normal respiratory effort.  No retractions. Lungs CTAB. Gastrointestinal: Soft and nontender. No distention. No abdominal bruits. No CVA tenderness. Musculoskeletal: No lower extremity tenderness nor edema.  No joint effusions. Neurologic:  Normal speech and language. No gross focal neurologic deficits are appreciated. No gait instability. Skin:  Skin is warm, dry and intact. No rash noted. Psychiatric: Mood and affect are normal. Speech and behavior are normal.  ____________________________________________    LABS (all labs ordered are listed, but only abnormal results are displayed)  Labs Reviewed  COMPREHENSIVE METABOLIC PANEL - Abnormal; Notable for the following:    Chloride 100 (*)    Glucose, Bld 221 (*)    Creatinine, Ser 1.68 (*)    Calcium 8.8 (*)    AST 47 (*)    GFR calc non Af Amer 48 (*)    GFR calc Af Amer 56 (*)    All other components within normal limits  CBC WITH DIFFERENTIAL/PLATELET - Abnormal; Notable for the following:    Neutro Abs 7.1 (*)    Lymphs Abs 0.5 (*)    Basophils Absolute 0.2 (*)    All other components within normal limits  TROPONIN I - Abnormal; Notable for the following:    Troponin I 0.05 (*)    All other components within normal limits  CK - Abnormal; Notable for the following:    Total CK 424 (*)    All other components within normal limits  CULTURE, GROUP A STREP ([facility] ONLY)  CULTURE, BLOOD (ROUTINE X 2)  CULTURE, BLOOD (ROUTINE X 2)  URINALYSIS COMPLETEWITH MICROSCOPIC ([facility] ONLY)  BRAIN NATRIURETIC PEPTIDE  TROPONIN I   ____________________________________________  EKG  EKG shows and this has been reverted by me, 1. Normal sinus tachycardia rate 111 bpm, there are flipped T waves laterally which are old. No acute ST elevation, LVH is noted.  Second EKG shows sinus rhythm rate 98, T wave changes persist, no acute ST elevation, LVH still noted. ____________________________________________  RADIOLOGY I have reviewed x-rays  ____________________________________________   PROCEDURES  Procedure(s) performed: None  Critical Care performed: None  ____________________________________________   INITIAL IMPRESSION / ASSESSMENT AND PLAN / ED COURSE  Pertinent labs & imaging results that were available during my care of the patient were reviewed by me and considered in my medical decision making (see chart for details).  Patient here with sore throat, no evidence of trismus or abscess on CT scan or exam. He does, however, have  evidence of dehydration with elevated creatinine and elevated total CK. I think he would benefit from IV antibiotics although this could still be a viral sore throat we will give him clindamycin. He does not appear to be septic. We are giving him IV fluids, patient does have a history of significant pickwickian body habitus and when he lay flat he is sats were somewhat low but I do not believe this is necessarily related to the current disease process. His chest x-ray is negative. CT scan is reassuring. No masses are palpated. There is nothing at this time to suggest ongoing ischemia because of his baseline atypical EKG troponin was obtained which is severely not markedly elevated but not 100% normal. Another troponin will be obtained. For all these reasons, I do think the patient would benefit from continued hydration. ____________________________________________   FINAL CLINICAL IMPRESSION(S) / ED DIAGNOSES  Final diagnoses:  None     Jeanmarie Plant, MD 10/03/14 [phone]

## 2014-10-03 NOTE — Progress Notes (Signed)
ANTIBIOTIC CONSULT NOTE - INITIAL  Pharmacy Consult for ampicillin/sulbactam Indication: Wound infection  No Known Allergies  Patient Measurements: Height:  (175.3 cm) Weight: 289 lb (131.09 kg) IBW/kg (Calculated) : 70.7  Vital Signs: Temp: 99.4 F (37.4 C) (09/17 2010) Temp Source: Oral (09/17 2010) BP: 168/93 mmHg (09/17 2010) Pulse Rate: 96 (09/17 2010) Intake/Output from previous day:   Intake/Output from this shift:    Labs:  Recent Labs  10/03/14 1435  WBC 8.8  HGB 14.4  PLT 186  CREATININE 1.68*   Estimated Creatinine Clearance: 75.3 mL/min (by C-G formula based on Cr of 1.68). No results for input(s): VANCOTROUGH, VANCOPEAK, VANCORANDOM, GENTTROUGH, GENTPEAK, GENTRANDOM, TOBRATROUGH, TOBRAPEAK, TOBRARND, AMIKACINPEAK, AMIKACINTROU, AMIKACIN in the last 72 hours.   Microbiology: No results found for this or any previous visit (from the past 720 hour(s)).  Medical History: Past Medical History  Diagnosis Date  . Hypertension   . OSA (obstructive sleep apnea)   . Obesity     Medications:  Anti-infectives    Start     Dose/Rate Route Frequency Ordered Stop   10/03/14 2200  Ampicillin-Sulbactam (UNASYN) 3 g in sodium chloride 0.9 % 100 mL IVPB     3 g 100 mL/hr over 60 Minutes Intravenous Every 6 hours 10/03/14 2138     10/03/14 1700  clindamycin (CLEOCIN) IVPB 600 mg     600 mg 100 mL/hr over 30 Minutes Intravenous  Once 10/03/14 1653 10/03/14 1800     Assessment: Pharmacy consulted to dose ampicillin/sulbactam for this 44 year old male who presented with pharyngitis.     Plan:  Patient's estimated CrCl is ~75 mL/min. Will start amp/sulbactam at 3 g IV q6h. Pharmacy will continue to monitor renal function and adjust dose if necessary.  Thank you for the consult.  Jodelle Red Swayne 10/03/2014,9:39 PM

## 2014-10-04 LAB — CBC
HEMATOCRIT: 42.6 % (ref 40.0–52.0)
Hemoglobin: 14 g/dL (ref 13.0–18.0)
MCH: 28.4 pg (ref 26.0–34.0)
MCHC: 32.9 g/dL (ref 32.0–36.0)
MCV: 86.1 fL (ref 80.0–100.0)
Platelets: 193 10*3/uL (ref 150–440)
RBC: 4.95 MIL/uL (ref 4.40–5.90)
RDW: 14.2 % (ref 11.5–14.5)
WBC: 5.4 10*3/uL (ref 3.8–10.6)

## 2014-10-04 LAB — BASIC METABOLIC PANEL
ANION GAP: 8 (ref 5–15)
BUN: 16 mg/dL (ref 6–20)
CO2: 25 mmol/L (ref 22–32)
Calcium: 8.2 mg/dL — ABNORMAL LOW (ref 8.9–10.3)
Chloride: 103 mmol/L (ref 101–111)
Creatinine, Ser: 1.23 mg/dL (ref 0.61–1.24)
GFR calc Af Amer: >60 mL/min (ref >60)
Glucose, Bld: 197 mg/dL — ABNORMAL HIGH (ref 65–99)
POTASSIUM: 4.3 mmol/L (ref 3.5–5.1)
SODIUM: 136 mmol/L (ref 135–145)

## 2014-10-04 LAB — TROPONIN I
TROPONIN I: 0.03 ng/mL (ref <0.031)
TROPONIN I: 0.03 ng/mL (ref <0.031)

## 2014-10-04 LAB — POCT RAPID STREP A: STREPTOCOCCUS, GROUP A SCREEN (DIRECT): NEGATIVE

## 2014-10-04 LAB — MAGNESIUM: Magnesium: 2 mg/dL (ref 1.7–2.4)

## 2014-10-04 LAB — PHOSPHORUS: PHOSPHORUS: 4.8 mg/dL — AB (ref 2.5–4.6)

## 2014-10-04 LAB — CK: CK TOTAL: 373 U/L (ref 49–397)

## 2014-10-04 MED ORDER — DEXAMETHASONE 4 MG PO TABS: 4.0000 mg | ORAL_TABLET | Freq: Two times a day (BID) | ORAL | Status: DC

## 2014-10-04 MED ORDER — CLINDAMYCIN HCL 300 MG PO CAPS: 300.0000 mg | ORAL_CAPSULE | Freq: Three times a day (TID) | ORAL | Status: DC

## 2014-10-04 NOTE — Discharge Instructions (Signed)
Take all medications as prescribed.  Any return or worsening of symptoms, notify your MD.

## 2014-10-04 NOTE — Progress Notes (Signed)
Pt stable. D/c instructions given and education provided. IV removed. Waiting on ride and will be driven home by family.

## 2014-10-04 NOTE — Discharge Summary (Signed)
[facility] [place] [facility]   PATIENT NAME: Rodney Hess    MR#:  [id number]  DATE OF BIRTH:  [birth year]  DATE OF ADMISSION:  10/03/2014 ADMITTING PHYSICIAN: Enid Baas, MD  DATE OF DISCHARGE:10/04/2014 PRIMARY CARE PHYSICIAN: PROVIDER NOT IN SYSTEM    ADMISSION DIAGNOSIS:  Dehydration [E86.0] Sore throat [J02.9] Acute renal injury [N17.9] Non-traumatic rhabdomyolysis [M62.82]  DISCHARGE DIAGNOSIS:  Active Problems:   Rhabdomyolysis   Pharyngitis   SECONDARY DIAGNOSIS:   Past Medical History  Diagnosis Date  . Hypertension   . OSA (obstructive sleep apnea)   . Obesity     HOSPITAL COURSE:  This is a 44 year old male with a history of hypertension who initially presented with muscle weakness and sore throat. For further details please further H&P.  1. Acute pharyngitis: Patient underwent CT scan which showed possible abscess. Patient was seen  by ENT. It was not felt the patient actually had an abscess. As per ENT, there was some vague hypodensity in the retropharyngeal area however no rim enhancement and with no elevation in white blood cell count next stiffness and improvement with 1 dose of IV clindamycin and Decadron, the likelihood of subclinical abscess seemed quite remote. He did undergo Fiberoptic Nasolaryngoscopy. Patient was tolerating his diet. Patient will be discharged with antibiotics and 2 days of Decadron.  2. Mild rhabdomyolysis: Patient presented with muscle tightness and spasm. His CPK was slightly elevated. He was provided IV fluids. CPK is now normal. I also went ahead and check potassium and magnesium and phosphorus level all of which were normal. His symptoms have much improved.  3. Essential hypertension: Patient will continue the patient medications.   DISCHARGE CONDITIONS AND DIET:  She is being discharged in stable condition to home on a heart healthy diet  CONSULTS OBTAINED:  Treatment Team:  Geanie Logan, MD  DRUG ALLERGIES:  No Known Allergies  DISCHARGE MEDICATIONS:   Current Discharge Medication List    START taking these medications   Details  clindamycin (CLEOCIN) 300 MG capsule Take 1 capsule (300 mg total) by mouth 3 (three) times daily. Qty: 16 capsule, Refills: 0    dexamethasone (DECADRON) 4 MG tablet Take 1 tablet (4 mg total) by mouth 2 (two) times daily. For 2 days Qty: 4 tablet, Refills: 0      CONTINUE these medications which have NOT CHANGED   Details  amLODipine (NORVASC) 5 MG tablet Take 5 mg by mouth daily.    hydrochlorothiazide (HYDRODIURIL) 12.5 MG tablet Take 12.5 mg by mouth every morning.              Today   CHIEF COMPLAINT:  Patient is doing much better this morning. Patient denies muscle spasm or contractions. Patient's able to move his upper arms without any pain. Patient denies any difficulty swallowing.   VITAL SIGNS:  Blood pressure 161/93, pulse 114, temperature 98.6 F (37 C), temperature source Oral, resp. rate 20, height  (1.753 m), weight 137.757 kg (303 lb 11.2 oz), SpO2 91 %.   REVIEW OF SYSTEMS:  Review of Systems  Constitutional: Negative for fever, chills and malaise/fatigue.  HENT: Negative for sore throat.   Eyes: Negative for blurred vision.  Respiratory: Negative for cough, hemoptysis, shortness of breath and wheezing.   Cardiovascular: Negative for chest pain, palpitations and leg swelling.  Gastrointestinal: Negative for nausea, vomiting, abdominal pain, diarrhea and blood in stool.  Genitourinary: Negative for dysuria.  Musculoskeletal: Negative for back pain.  Neurological:  Negative for dizziness, tremors and headaches.  Endo/Heme/Allergies: Does not bruise/bleed easily.  Psychiatric/Behavioral: Negative for depression, suicidal ideas and substance abuse.     PHYSICAL EXAMINATION:  GENERAL:  44 y.o.-year-old patient lying in the bed with no acute distress.  NECK:  Supple, no jugular venous  distention. No thyroid enlargement, no tenderness.  LUNGS: Normal breath sounds bilaterally, no wheezing, rales,rhonchi  No use of accessory muscles of respiration.  CARDIOVASCULAR: S1, S2 normal. No murmurs, rubs, or gallops.  ABDOMEN: Soft, non-tender, non-distended. Bowel sounds present. No organomegaly or mass.  EXTREMITIES: No pedal edema, cyanosis, or clubbing.  PSYCHIATRIC: The patient is alert and oriented x 3.  SKIN: No obvious rash, lesion, or ulcer.   DATA REVIEW:   CBC  Recent Labs Lab 10/04/14 0221  WBC 5.4  HGB 14.0  HCT 42.6  PLT 193    Chemistries   Recent Labs Lab 10/03/14 1435 10/04/14 0221 10/04/14 0754  NA 137 136  --   K 3.7 4.3  --   CL 100* 103  --   CO2 27 25  --   GLUCOSE 221* 197*  --   BUN 18 16  --   CREATININE 1.68* 1.23  --   CALCIUM 8.8* 8.2*  --   MG  --   --  2.0  AST 47*  --   --   ALT 38  --   --   ALKPHOS 46  --   --   BILITOT 0.8  --   --     Cardiac Enzymes  Recent Labs Lab 10/03/14 2014 10/04/14 0221 10/04/14 0754  TROPONINI 0.04* 0.03 0.03    Microbiology Results  @  RADIOLOGY:  Dg Chest 2 View  10/03/2014   CLINICAL DATA:  Smoker. Pt states his muscles in his arms/legs feel like he has been lifting weights, tingling since he woke up yesterday morning, has been able to walk. C/o sore throat, states its hard to swallow. Pt is diaphoretic. Sats 89-91%  EXAM: CHEST  2 VIEW  COMPARISON:  05/31/2011  FINDINGS: The heart size and mediastinal contours are within normal limits. Both lungs are clear. No pleural effusion or pneumothorax. Bony thorax is intact.  IMPRESSION: No active cardiopulmonary disease.   Electronically Signed   By: Amie Portland M.D.   On: 10/03/2014 15:07   Ct Soft Tissue Neck W Contrast  10/03/2014   CLINICAL DATA:  Pt states his muscles in his arms/legs feel like he has been lifting weights, tingling since he woke up yesterday morning, has been able to walk. C/o sore throat, states its hard  to swallow. Pt is diaphoretic. Sats 89-91% on room air, denies asthma/allergies/copd.  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  EXAM: CT NECK WITH CONTRAST  TECHNIQUE: Multidetector CT imaging of the neck was performed using the standard protocol following the bolus administration of intravenous contrast.  CONTRAST:  75mL OMNIPAQUE IOHEXOL 300 MG/ML  SOLN  COMPARISON:  None.  FINDINGS: Pharynx and larynx: There is low-density soft tissue fullness posterior to the oral pharynx extending to the larynx. This has Hounsfield units averaging 6. There is no discrete mass. No significant mucosal asymmetry. Laryngeal airway and trachea are widely patent.  Salivary glands: Unremarkable.  Thyroid: Normal.  Lymph nodes: There are scattered prominent lymph nodes mostly in the submandibular region, largest measuring 11 mm in short axis, likely all reactive.  Vascular: The internal carotid arteries are tortuous, swinging posterior to the oral pharynx. A  single focus of calcifications noted in the left carotid bulb.  Limited intracranial: Unremarkable  Visualized orbits: Unremarkable  Mastoids and visualized paranasal sinuses: Mucous retention cysts partly opacifies the left maxillary sinus. Remaining visualized sinuses are clear. Clear mastoid air cells and middle ear cavities.  Skeleton: Extensive dental disease with multiple areas of periapical lucency involving both maxillary and mandibular teeth. No fracture. No osteoblastic or osteolytic lesions.  Upper chest: Minor dependent subsegmental atelectasis in the upper lungs. Mild emphysema noted in the upper lobes. No upper mediastinal mass or adenopathy.  IMPRESSION: 1. There is low attenuation material in the retropharyngeal space which may reflect edema. It could potentially reflect an abscess if there are consistent clinical symptoms. The etiology of this is unclear. 2. No masses.  No significant adenopathy.  Airway is widely patent. 3. There is significant dental  disease, but no associated soft tissue abscess or inflammation.   Electronically Signed   By: Amie Portland M.D.   On: 10/03/2014 16:21      Management plans discussed with the patient and he is in agreement. Stable for discharge home  Patient should follow up with PCP in one week  CODE STATUS:     Code Status Orders        Start     Ordered   10/03/14 2002  Full code   Continuous     10/03/14 2001      TOTAL TIME TAKING CARE OF THIS PATIENT: 35 minutes.    MODY, SITAL M.D on 10/04/2014 at 10:37 AM  Between 7am to 6pm - Pager - [phone] After 6pm go to [email or web address] - password [email or web address]  [place] [facility]  Office  [phone]  CC: Primary care physician; PROVIDER NOT IN SYSTEM

## 2014-10-06 LAB — CULTURE, GROUP A STREP (THRC)

## 2014-10-08 LAB — CULTURE, BLOOD (ROUTINE X 2)
Culture: NO GROWTH
Culture: NO GROWTH

## 2015-03-31 DIAGNOSIS — E785 Hyperlipidemia, unspecified: Secondary | ICD-10-CM | POA: Insufficient documentation

## 2015-09-14 DIAGNOSIS — E1165 Type 2 diabetes mellitus with hyperglycemia: Secondary | ICD-10-CM | POA: Insufficient documentation

## 2015-09-14 DIAGNOSIS — I609 Nontraumatic subarachnoid hemorrhage, unspecified: Secondary | ICD-10-CM | POA: Insufficient documentation

## 2016-02-07 DIAGNOSIS — Z8673 Personal history of transient ischemic attack (TIA), and cerebral infarction without residual deficits: Secondary | ICD-10-CM | POA: Insufficient documentation

## 2016-10-17 DIAGNOSIS — Z87898 Personal history of other specified conditions: Secondary | ICD-10-CM | POA: Insufficient documentation

## 2017-07-09 ENCOUNTER — Encounter: Payer: Self-pay | Admitting: Physical Therapy

## 2017-07-09 ENCOUNTER — Ambulatory Visit: Payer: BLUE CROSS/BLUE SHIELD | Attending: Neurology | Admitting: Physical Therapy

## 2017-07-09 DIAGNOSIS — R262 Difficulty in walking, not elsewhere classified: Secondary | ICD-10-CM | POA: Insufficient documentation

## 2017-07-09 DIAGNOSIS — M6281 Muscle weakness (generalized): Secondary | ICD-10-CM | POA: Diagnosis present

## 2017-07-09 DIAGNOSIS — G8929 Other chronic pain: Secondary | ICD-10-CM | POA: Insufficient documentation

## 2017-07-09 DIAGNOSIS — M545 Low back pain: Secondary | ICD-10-CM | POA: Diagnosis not present
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
## 2018-02-18 ENCOUNTER — Other Ambulatory Visit: Payer: Self-pay | Admitting: Acute Care

## 2018-02-18 DIAGNOSIS — M519 Unspecified thoracic, thoracolumbar and lumbosacral intervertebral disc disorder: Secondary | ICD-10-CM

## 2018-02-28 ENCOUNTER — Ambulatory Visit
Admission: RE | Admit: 2018-02-28 | Discharge: 2018-02-28 | Disposition: A | Discharge status: Home / Self Care | Payer: Medicare Other | Source: Ambulatory Visit | Attending: Acute Care | Admitting: Acute Care

## 2018-02-28 DIAGNOSIS — M519 Unspecified thoracic, thoracolumbar and lumbosacral intervertebral disc disorder: Secondary | ICD-10-CM

## 2018-12-06 DIAGNOSIS — F32A Depression, unspecified: Secondary | ICD-10-CM | POA: Insufficient documentation

## 2018-12-06 DIAGNOSIS — E118 Type 2 diabetes mellitus with unspecified complications: Secondary | ICD-10-CM | POA: Insufficient documentation

## 2019-04-11 ENCOUNTER — Ambulatory Visit: Payer: Medicare Other | Attending: Internal Medicine

## 2019-04-11 DIAGNOSIS — Z23 Encounter for immunization: Secondary | ICD-10-CM

## 2019-04-11 NOTE — Progress Notes (Signed)
   [id number]  Name:  Rodney APACHITO Sr.    MRN: [id number] DOB: [birth year]  04/11/2019  Mr. Rodney Hess was observed post Covid-19 immunization for 15 minutes without incident. He was provided with Vaccine Information Sheet and instruction to access the V-Safe system.   Mr. Rodney Hess was instructed to call 911 with any severe reactions post vaccine: Marland Kitchen Difficulty breathing  . Swelling of face and throat  . A fast heartbeat  . A bad rash all over body  . Dizziness and weakness   Immunizations Administered    Name Date Dose VIS Date Route   Pfizer COVID-19 Vaccine 04/11/2019 12:41 PM 0.3 mL 12/27/2018 Intramuscular   Manufacturer: [employer], [employer]   Lot: [id number]   NDC: [address]-1

## 2019-05-06 ENCOUNTER — Ambulatory Visit: Payer: Medicare Other | Attending: Internal Medicine

## 2019-05-06 DIAGNOSIS — Z23 Encounter for immunization: Secondary | ICD-10-CM

## 2019-05-06 NOTE — Progress Notes (Signed)
   [id number]  Name:  CUONG MOORMAN Sr.    MRN: [id number] DOB: 1970/11/05  05/06/2019  Mr. Reinders was observed post Covid-19 immunization for 15 minutes without incident. He was provided with Vaccine Information Sheet and instruction to access the V-Safe system.   Mr. Housholder was instructed to call 911 with any severe reactions post vaccine: Marland Kitchen Difficulty breathing  . Swelling of face and throat  . A fast heartbeat  . A bad rash all over body  . Dizziness and weakness   Immunizations Administered    Name Date Dose VIS Date Route   Pfizer COVID-19 Vaccine 05/06/2019 12:50 PM 0.3 mL 03/12/2018 Intramuscular   Manufacturer: [employer], [employer]   Lot: [id number]   NDC: [address]-9

## 2019-10-16 DIAGNOSIS — E559 Vitamin D deficiency, unspecified: Secondary | ICD-10-CM | POA: Insufficient documentation

## 2020-05-18 ENCOUNTER — Emergency Department
Admission: EM | Admit: 2020-05-18 | Discharge: 2020-05-19 | Disposition: A | Discharge status: Home / Self Care | Payer: Medicare Other | Attending: Emergency Medicine | Admitting: Emergency Medicine

## 2020-05-18 ENCOUNTER — Emergency Department: Payer: Medicare Other

## 2020-05-18 ENCOUNTER — Other Ambulatory Visit: Payer: Self-pay

## 2020-05-18 DIAGNOSIS — F17210 Nicotine dependence, cigarettes, uncomplicated: Secondary | ICD-10-CM | POA: Diagnosis not present

## 2020-05-18 DIAGNOSIS — Z79899 Other long term (current) drug therapy: Secondary | ICD-10-CM | POA: Insufficient documentation

## 2020-05-18 DIAGNOSIS — S82831A Other fracture of upper and lower end of right fibula, initial encounter for closed fracture: Secondary | ICD-10-CM | POA: Insufficient documentation

## 2020-05-18 DIAGNOSIS — S0990XA Unspecified injury of head, initial encounter: Secondary | ICD-10-CM | POA: Insufficient documentation

## 2020-05-18 DIAGNOSIS — I10 Essential (primary) hypertension: Secondary | ICD-10-CM | POA: Insufficient documentation

## 2020-05-18 DIAGNOSIS — R220 Localized swelling, mass and lump, head: Secondary | ICD-10-CM | POA: Insufficient documentation

## 2020-05-18 DIAGNOSIS — S8991XA Unspecified injury of right lower leg, initial encounter: Secondary | ICD-10-CM | POA: Diagnosis present

## 2020-05-18 DIAGNOSIS — W19XXXA Unspecified fall, initial encounter: Secondary | ICD-10-CM

## 2020-05-18 MED ORDER — OXYCODONE-ACETAMINOPHEN 5-325 MG PO TABS: 1.0000 | ORAL_TABLET | ORAL | 0 refills | Status: AC | PRN

## 2020-05-18 MED ORDER — ACETAMINOPHEN 325 MG PO TABS
650.0000 mg | ORAL_TABLET | Freq: Once | ORAL | Status: AC
  Administered 2020-05-19: 650 mg via ORAL
  Filled 2020-05-18: qty 2

## 2020-05-18 MED ORDER — OXYCODONE-ACETAMINOPHEN 5-325 MG PO TABS
1.0000 | ORAL_TABLET | Freq: Once | ORAL | Status: AC
  Administered 2020-05-19: 1 via ORAL
  Filled 2020-05-18: qty 1
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
## 2020-05-18 NOTE — Discharge Instructions (Addendum)
You have been prescribed pain medication to take as needed.  You may also take an additional 650 mg of Tylenol up to every 6 hours.  You may alternate this with an anti-inflammatory such as ibuprofen, 800 mg every 8 hours.  Please use the cam boot at all times, and remain nonweightbearing on the right lower extremity until you follow-up with orthopedics.

## 2020-05-19 MED ORDER — OXYCODONE-ACETAMINOPHEN 5-325 MG PO TABS
1.0000 | ORAL_TABLET | Freq: Once | ORAL | Status: DC
  Filled 2020-05-19: qty 1
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
## 2022-04-26 DIAGNOSIS — F3342 Major depressive disorder, recurrent, in full remission: Secondary | ICD-10-CM | POA: Insufficient documentation

## 2022-04-26 DIAGNOSIS — F17200 Nicotine dependence, unspecified, uncomplicated: Secondary | ICD-10-CM | POA: Insufficient documentation

## 2022-07-31 ENCOUNTER — Ambulatory Visit (INDEPENDENT_AMBULATORY_CARE_PROVIDER_SITE_OTHER): Payer: Medicare Other | Admitting: Internal Medicine

## 2022-07-31 VITALS — BP 161/102 | HR 68 | Resp 16 | Ht 69.0 in | Wt 257.0 lb

## 2022-07-31 DIAGNOSIS — I10 Essential (primary) hypertension: Secondary | ICD-10-CM | POA: Diagnosis not present

## 2022-07-31 DIAGNOSIS — Z7189 Other specified counseling: Secondary | ICD-10-CM | POA: Insufficient documentation

## 2022-07-31 DIAGNOSIS — G4733 Obstructive sleep apnea (adult) (pediatric): Secondary | ICD-10-CM
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
## 2022-07-31 NOTE — Patient Instructions (Signed)
Living With Sleep Apnea Sleep apnea is a condition in which breathing pauses or becomes shallow during sleep. Sleep apnea is most commonly caused by a collapsed or blocked airway. People with sleep apnea usually snore loudly. They may have times when they gasp and stop breathing for 10 seconds or more during sleep. This may happen many times during the night. The breaks in breathing also interrupt the deep sleep that you need to feel rested. Even if you do not completely wake up from the gaps in breathing, your sleep may not be restful and you feel tired during the day. You may also have a headache in the morning and low energy during the day, and you may feel anxious or depressed. How can sleep apnea affect me? Sleep apnea increases your chances of extreme tiredness during the day (daytime fatigue). It can also increase your risk for health conditions, such as: Heart attack. Stroke. Obesity. Type 2 diabetes. Heart failure. Irregular heartbeat. High blood pressure. If you have daytime fatigue as a result of sleep apnea, you may be more likely to: Perform poorly at school or work. Fall asleep while driving. Have difficulty with attention. Develop depression or anxiety. Have sexual dysfunction. What actions can I take to manage sleep apnea? Sleep apnea treatment  If you were given a device to open your airway while you sleep, use it only as told by your health care provider. You may be given: An oral appliance. This is a custom-made mouthpiece that shifts your lower jaw forward. A continuous positive airway pressure (CPAP) device. This device blows air through a mask when you breathe out (exhale). A nasal expiratory positive airway pressure (EPAP) device. This device has valves that you put into each nostril. A bi-level positive airway pressure (BIPAP) device. This device blows air through a mask when you breathe in (inhale) and breathe out (exhale). You may need surgery if other treatments  do not work for you. Sleep habits Go to sleep and wake up at the same time every day. This helps set your internal clock (circadian rhythm) for sleeping. If you stay up later than usual, such as on weekends, try to get up in the morning within 2 hours of your normal wake time. Try to get at least 7-9 hours of sleep each night. Stop using a computer, tablet, and mobile phone a few hours before bedtime. Do not take long naps during the day. If you nap, limit it to 30 minutes. Have a relaxing bedtime routine. Reading or listening to music may relax you and help you sleep. Use your bedroom only for sleep. Keep your television and computer out of your bedroom. Keep your bedroom cool, dark, and quiet. Use a supportive mattress and pillows. Follow your health care provider's instructions for other changes to sleep habits. Nutrition Do not eat heavy meals in the evening. Do not have caffeine in the later part of the day. The effects of caffeine can last for more than 5 hours. Follow your health care provider's or dietitian's instructions for any diet changes. Lifestyle     Do not drink alcohol before bedtime. Alcohol can cause you to fall asleep at first, but then it can cause you to wake up in the middle of the night and have trouble getting back to sleep. Do not use any products that contain nicotine or tobacco. These products include cigarettes, chewing tobacco, and vaping devices, such as e-cigarettes. If you need help quitting, ask your health care provider. Medicines Take   over-the-counter and prescription medicines only as told by your health care provider. Do not use over-the-counter sleep medicine. You can become dependent on this medicine, and it can make sleep apnea worse. Do not use medicines, such as sedatives and narcotics, unless told by your health care provider. Activity Exercise on most days, but avoid exercising in the evening. Exercising near bedtime can interfere with  sleeping. If possible, spend time outside every day. Natural light helps regulate your circadian rhythm. General information Lose weight if you need to, and maintain a healthy weight. Keep all follow-up visits. This is important. If you are having surgery, make sure to tell your health care provider that you have sleep apnea. You may need to bring your device with you. Where to find more information Learn more about sleep apnea and daytime fatigue from: American Sleep Association: sleepassociation.org National Sleep Foundation: sleepfoundation.org National Heart, Lung, and Blood Institute: nhlbi.nih.gov Summary Sleep apnea is a condition in which breathing pauses or becomes shallow during sleep. Sleep apnea can cause daytime fatigue and other serious health conditions. You may need to wear a device while sleeping to help keep your airway open. If you are having surgery, make sure to tell your health care provider that you have sleep apnea. You may need to bring your device with you. Making changes to sleep habits, diet, lifestyle, and activity can help you manage sleep apnea. This information is not intended to replace advice given to you by your health care provider. Make sure you discuss any questions you have with your health care provider. Document Revised: 08/11/2020 Document Reviewed: 12/12/2019 Elsevier Patient Education  2023 Elsevier Inc.  

## 2022-10-20 ENCOUNTER — Ambulatory Visit
Admission: EM | Admit: 2022-10-20 | Discharge: 2022-10-20 | Disposition: A | Discharge status: Home / Self Care | Payer: Medicare Other | Attending: Emergency Medicine | Admitting: Emergency Medicine

## 2022-10-20 DIAGNOSIS — L989 Disorder of the skin and subcutaneous tissue, unspecified: Secondary | ICD-10-CM | POA: Diagnosis not present

## 2022-10-20 MED ORDER — CEPHALEXIN 500 MG PO CAPS: 500.0000 mg | ORAL_CAPSULE | Freq: Three times a day (TID) | ORAL | 0 refills | Status: AC

## 2022-10-20 MED ORDER — MUPIROCIN 2 % EX OINT: 1.0000 | TOPICAL_OINTMENT | Freq: Two times a day (BID) | CUTANEOUS | 0 refills | Status: AC

## 2022-10-20 NOTE — Discharge Instructions (Addendum)
You need to avoid disturbing the lesion on your scalp and allow to scab over and heal.  When should apply the mupirocin ointment to the open lesion twice daily for 5 days to prevent infection.  Take the oral Keflex 500 mg 3 times a day with food for 7 days to prevent infection.  You may wash your hair as normal but do not pick your hair or pick the lesion.  Whether or not hair returns to the area will be a matter of watching and weight over time.  If the lesion is not healing you need to follow-up with dermatology.  I have given you contact information from its consenter above.  If the lesion becomes more red, you start developing fevers, or start draining pus you need to return for reevaluation.

## 2022-10-20 NOTE — ED Triage Notes (Signed)
Pt c/o hair loss and scalp injury due to using glue with a CPAP.   Pt does not know how long the glue was in his hair before he was able to remove it.

## 2022-10-20 NOTE — ED Provider Notes (Signed)
[facility]    CSN: [id number] Arrival date & time: 10/20/22  1146      History   Chief Complaint Chief Complaint  Patient presents with   Head Injury         HPI Rodney MARSALIS Sr. is a 52 y.o. male.   HPI  52 year old male with a past medical history significant for type 2 diabetes, OSA on CPAP, subarachnoid hemorrhage, MDD, essential hypertension, seizures, and dyslipidemia presents for evaluation of a scalp wound.  He reports that 3 to 4 weeks ago he had a sleep study and where one of the electrodes was glued to his scalp on the right side he was unable to get all of the glue off and it caused irritation to the scalp as well as a loss of hair.  He states that when he uses a pick on his hair he catches the scabbed area and tears the scab off.  He denies any fever or drainage from the area.  Past Medical History:  Diagnosis Date   Hypertension    Obesity    OSA (obstructive sleep apnea)     Patient Active Problem List   Diagnosis Date Noted   OSA (obstructive sleep apnea) 07/31/2022   CPAP use counseling 07/31/2022   Major depressive disorder, recurrent, in full remission (HCC) 04/26/2022   Tobacco use disorder 04/26/2022   Vitamin D deficiency 10/16/2019   Depression 12/06/2018   Type 2 diabetes with complication (HCC) 12/06/2018   History of seizure 10/17/2016   Personal history of transient ischemic attack (TIA), and cerebral infarction without residual deficits 02/07/2016   SAH (subarachnoid hemorrhage) (HCC) 09/14/2015   Type 2 diabetes mellitus with hyperglycemia (HCC) 09/14/2015   Dyslipidemia 03/31/2015   Rhabdomyolysis 10/03/2014   Pharyngitis 10/03/2014   Essential hypertension 04/17/2014    Past Surgical History:  Procedure Laterality Date   THROAT SURGERY         Home Medications    Prior to Admission medications   Medication Sig Start Date End Date Taking? Authorizing Provider  aspirin EC 81 MG tablet Take 1 tablet by mouth  daily.   Yes [provider]  atorvastatin (LIPITOR) 40 MG tablet Take 1 tablet by mouth every evening. 01/31/16  Yes [provider]  cephALEXin (KEFLEX) 500 MG capsule Take 1 capsule (500 mg total) by mouth 3 (three) times daily for 7 days. 10/20/22 10/27/22 Yes Becky Augusta, NP  hydrochlorothiazide (HYDRODIURIL) 25 MG tablet Take 1 tablet by mouth daily. 02/23/20 03/20/23 Yes [provider]  labetalol (NORMODYNE) 200 MG tablet Take by mouth. 01/22/16 03/17/23 Yes [provider]  linagliptin (TRADJENTA) 5 MG TABS tablet Take 1 tablet by mouth daily. 07/24/22  Yes [provider]  losartan (COZAAR) 100 MG tablet Take 1 tablet by mouth daily. 01/20/16  Yes [provider]  metFORMIN (GLUCOPHAGE) 500 MG tablet Take by mouth. 03/19/20 03/27/23 Yes [provider]  mirtazapine (REMERON) 30 MG tablet Take 1 tablet by mouth at bedtime. 01/22/16  Yes [provider]  mupirocin ointment (BACTROBAN) 2 % Apply 1 Application topically 2 (two) times daily. 10/20/22  Yes Becky Augusta, NP  Omega-3 Fatty Acids (FISH OIL) 1000 MG CAPS Take by mouth. 10/20/15  Yes [provider]  potassium chloride SA (KLOR-CON M) 20 MEQ tablet Take 1 tablet by mouth daily. 02/23/20 05/30/23 Yes [provider]  amLODipine (NORVASC) 2.5 MG tablet Take 1 tablet by mouth daily. 10/17/21 10/17/22  [provider]  traZODone (DESYREL) 50 MG tablet Take by mouth. 10/17/21 10/17/22  [provider]    Family History Family History  Problem Relation Age of Onset   CAD Mother     Social History Social History   Tobacco Use   Smoking status: Every Day    Current packs/day: 0.00    Types: Cigarettes    Last attempt to quit: 10/01/2014    Years since quitting: 8.0   Smokeless tobacco: Never  Vaping Use   Vaping status: Never Used  Substance Use Topics   Alcohol use: Yes    Alcohol/week: 0.0 standard drinks of alcohol    Comment: daily, mostly  beer   Drug use: No     Allergies   Patient has no known allergies.   Review of Systems Review of Systems  Constitutional:  Negative for fever.  Skin:  Positive for color change and wound.     Physical Exam Triage Vital Signs ED Triage Vitals [10/20/22 1203]  Encounter Vitals Group     BP      Systolic BP Percentile      Diastolic BP Percentile      Pulse      Resp      Temp      Temp src      SpO2      Weight      Height 5\' 9"  (1.753 m)     Head Circumference      Peak Flow      Pain Score 0     Pain Loc      Pain Education      Exclude from Growth Chart    No data found.  Updated Vital Signs BP (!) 147/100 (BP Location: Left Arm)   Pulse 83   Temp 98.8 F (37.1 C) (Oral)   Ht 5\' 9"  (1.753 m)   SpO2 94%   BMI 37.95 kg/m   Visual Acuity Right Eye Distance:   Left Eye Distance:   Bilateral Distance:    Right Eye Near:   Left Eye Near:    Bilateral Near:     Physical Exam Vitals and nursing note reviewed.  Constitutional:      Appearance: Normal appearance. He is not ill-appearing.  HENT:     Head: Normocephalic and atraumatic.  Skin:    General: Skin is warm and dry.     Capillary Refill: Capillary refill takes less than 2 seconds.     Findings: Lesion present. No erythema.  Neurological:     General: No focal deficit present.     Mental Status: He is alert and oriented to person, place, and time.      UC Treatments / Results  Labs (all labs ordered are listed, but only abnormal results are displayed) Labs Reviewed - No data to display  EKG   Radiology No results found.  Procedures Procedures (including critical care time)  Medications Ordered in UC Medications - No data to display  Initial Impression / Assessment and Plan / UC Course  I have reviewed the triage vital signs and the nursing notes.  Pertinent labs & imaging results that were available during my care of the patient were reviewed by me and considered in my  medical decision making (see chart for details).   Patient is a pleasant, nontoxic-appearing 52 year old male presenting for evaluation of a lesion on the right temporal parietal aspect of his scalp that has been present for the last 3 to 4 weeks.  The lesion began as a result of glue from the electrode used in a sleep study.  He reports that he was advised to use conditioner to get the glue out of his hair but he was unable to get all of the glue out.  She is not sure how long the glue was in his hair and he is symptomatic of having hair loss as well as a continual open wound.  No fever or drainage.  As you can see image above, there is some well-healed skin but there is an open area.  There is no induration, fluctuance, heat, or drainage on exam.  The patient reports that when he picks his hair he catches the scab and tears it open.  I have advised him to stop picking his hair for the time being and let the scalp heal.  The prospect of regrowing hair in the area will have to be determined over time.  I am going to start him on mupirocin topically twice daily with oral Keflex 5 mg 3 times a day for 7 days to cover for any potential infection.  He should allow the area to heal on its own.  If it does not heal, or the area worsens, he needs to follow-up with dermatology.  Final Clinical Impressions(s) / UC Diagnoses   Final diagnoses:  Skin lesion of scalp     Discharge Instructions      You need to avoid disturbing the lesion on your scalp and allow to scab over and heal.  When should apply the mupirocin ointment to the open lesion twice daily for 5 days to prevent infection.  Take the oral Keflex 500 mg 3 times a day with food for 7 days to prevent infection.  You may wash your hair as normal but do not pick your hair or pick the lesion.  Whether or not hair returns to the area will be a matter of watching and weight over time.  If the lesion is not healing you need to follow-up with  dermatology.  I have given you contact information from its consenter above.  If the lesion becomes more red, you start developing fevers, or start draining pus you need to return for reevaluation.     ED Prescriptions     Medication Sig Dispense Auth. Provider   mupirocin ointment (BACTROBAN) 2 % Apply 1 Application topically 2 (two) times daily. 22 g Becky Augusta, NP   cephALEXin (KEFLEX) 500 MG capsule Take 1 capsule (500 mg total) by mouth 3 (three) times daily for 7 days. 21 capsule Becky Augusta, NP      PDMP not reviewed this encounter.   Becky Augusta, NP 10/20/22 1218

## 2023-03-04 NOTE — Progress Notes (Unsigned)
 [facility] [address] [place], [place] [address]  Pulmonary Sleep Medicine   Office Visit Note  Patient Name: Rodney BERNARDS Sr. DOB: Mar 09, 1970 MRN [id number]    Chief Complaint: Obstructive Sleep Apnea visit  Brief History:  Rodney Hess is seen today for a follow up after restart on APAP 10-20cmh20.  The patient has a 1 year 2 month  history of sleep apnea. Patient is using PAP nightly.  The patient feels rested after sleeping with PAP.  The patient reports benefit from PAP use. Reported sleepiness is  improved and the Epworth Sleepiness Score is 3 out of 24. The patient does not take naps. The patient complains of the following: some oral dryness which has resolved lately.  The compliance download shows 72% compliance with an average use time of 5 hours 43 min. The AHI is 6.6  The patient does not complain of limb movements disrupting sleep. Patient sleeps during day with machine and stays up all night much like when he used to work third shift.  ROS  General: (-) fever, (-) chills, (-) night sweat Nose and Sinuses: (-) nasal stuffiness or itchiness, (-) postnasal drip, (-) nosebleeds, (-) sinus trouble. Mouth and Throat: (-) sore throat, (-) hoarseness. Neck: (-) swollen glands, (-) enlarged thyroid, (-) neck pain. Respiratory: - cough, - shortness of breath, - wheezing. Neurologic: - numbness, - tingling. Psychiatric: + anxiety, + depression   Current Medication: Outpatient Encounter Medications as of 03/05/2023  Medication Sig   prednisoLONE acetate (PRED FORTE) 1 % ophthalmic suspension Administer 1 drop to the right eye in the morning.   amLODipine (NORVASC) 2.5 MG tablet Take 1 tablet by mouth daily.   aspirin EC 81 MG tablet Take 1 tablet by mouth daily.   atorvastatin (LIPITOR) 40 MG tablet Take 1 tablet by mouth every evening.   hydrochlorothiazide (HYDRODIURIL) 25 MG tablet Take 1 tablet by mouth daily.   labetalol (NORMODYNE) 200 MG tablet Take by mouth.    linagliptin (TRADJENTA) 5 MG TABS tablet Take 1 tablet by mouth daily.   losartan (COZAAR) 100 MG tablet Take 1 tablet by mouth daily.   metFORMIN (GLUCOPHAGE) 500 MG tablet Take by mouth.   mirtazapine (REMERON) 30 MG tablet Take 1 tablet by mouth at bedtime.   mupirocin ointment (BACTROBAN) 2 % Apply 1 Application topically 2 (two) times daily.   Omega-3 Fatty Acids (FISH OIL) 1000 MG CAPS Take by mouth.   PARoxetine (PAXIL) 30 MG tablet Take 30 mg by mouth daily.   potassium chloride SA (KLOR-CON M) 20 MEQ tablet Take 1 tablet by mouth daily.   traZODone (DESYREL) 50 MG tablet Take by mouth.   No facility-administered encounter medications on file as of 03/05/2023.    Surgical History: Past Surgical History:  Procedure Laterality Date   THROAT SURGERY      Medical History: Past Medical History:  Diagnosis Date   Hypertension    Obesity    OSA (obstructive sleep apnea)     Family History: Non contributory to the present illness  Social History: Social History   Socioeconomic History   Marital status: Divorced    Spouse name: Not on file   Number of children: Not on file   Years of education: Not on file   Highest education level: Not on file  Occupational History   Not on file  Tobacco Use   Smoking status: Every Day    Current packs/day: 0.00    Types: Cigarettes  Last attempt to quit: 10/01/2014    Years since quitting: 8.4   Smokeless tobacco: Never  Vaping Use   Vaping status: Never Used  Substance and Sexual Activity   Alcohol use: Yes    Alcohol/week: 0.0 standard drinks of alcohol    Comment: daily, mostly beer   Drug use: No   Sexual activity: Not on file  Other Topics Concern   Not on file  Social History Narrative   Lives at home with family.   Social Drivers of Corporate investment banker Strain: Low Risk  (04/26/2022)   Received [facility]   Overall Financial Resource Strain (CARDIA)    Difficulty of Paying Living Expenses:  Not hard at all  Food Insecurity: No Food Insecurity (04/26/2022)   Received [facility]   Hunger Vital Sign    Worried About Running Out of Food in the Last Year: Never true    Ran Out of Food in the Last Year: Never true  Transportation Needs: No Transportation Needs (04/26/2022)   Received from [facility]    Lack of Transportation (Medical): No    Lack of Transportation (Non-Medical): No  Physical Activity: Not on file  Stress: Not on file  Social Connections: Not on file  Intimate Partner Violence: Not At Risk (04/26/2022)   Received from [facility]   Humiliation, Afraid, Rape, and Kick questionnaire    Fear of Current or Ex-Partner: No    Emotionally Abused: No    Physically Abused: No    Sexually Abused: No    Vital Signs: Blood pressure (!) 167/104, pulse 79, resp. rate 20, height 5\' 9"  (1.753 m), weight 258 lb 4.8 oz (117.2 kg), SpO2 97%. Body mass index is 38.14 kg/m.    Examination: General Appearance: The patient is well-developed, well-nourished, and in no distress. Neck Circumference: 47cm Skin: Gross inspection of skin unremarkable. Head: normocephalic, no gross deformities. Eyes: no gross deformities noted. ENT: ears appear grossly normal Neurologic: Alert and oriented. No involuntary movements.  STOP BANG RISK ASSESSMENT S (snore) Have you been told that you snore?     No   T (tired) Are you often tired, fatigued, or sleepy during the day?   NO  O (obstruction) Do you stop breathing, choke, or gasp during sleep? NO   P (pressure) Do you have or are you being treated for high blood pressure? YES   B (BMI) Is your body index greater than 35 kg/m? YES   A (age) Are you 75 years old or older? YES   N (neck) Do you have a neck circumference greater than 16 inches?   YES   G (gender) Are you a male? YES   TOTAL STOP/BANG "YES" ANSWERS 5       A STOP-Bang score of 2 or less is considered low risk, and a  score of 5 or more is high risk for having either moderate or severe OSA. For people who score 3 or 4, doctors may need to perform further assessment to determine how likely they are to have OSA.         EPWORTH SLEEPINESS SCALE:  Scale:  (0)= no chance of dozing; (1)= slight chance of dozing; (2)= moderate chance of dozing; (3)= high chance of dozing  Chance  Situtation    Sitting and reading: 1    Watching TV: 1    Sitting Inactive in public: 0    As  a passenger in car: 0      Lying down to rest: 1    Sitting and talking: 0    Sitting quielty after lunch: 0    In a car, stopped in traffic: 0   TOTAL SCORE:   3 out of 24    SLEEP STUDIES:  PSG - 12/24/21 - AHI 36.8/hr   REM AHI 95.1/hr  min Sp02 82% Titration - 01/22/22 - APAP 10-20cmH20 Titration - 09/13/22 - APAP 10-20cmH20   CPAP COMPLIANCE DATA:  Date Range: 01/03/2023-02/03/2023  Average Daily Use: 5 hours 43 min  Median Use: 5 hr 43 min  Compliance for > 4 Hours: 72% days  AHI: 6.6 respiratory events per hour  Days Used: 30/32  Mask Leak: 120  95th Percentile Pressure: 10.3 cmh20         LABS: No results found for this or any previous visit (from the past 2160 hours).  Radiology: No results found.  No results found.  No results found.    Assessment and Plan: Patient Active Problem List   Diagnosis Date Noted   OSA (obstructive sleep apnea) 07/31/2022   CPAP use counseling 07/31/2022   Major depressive disorder, recurrent, in full remission (HCC) 04/26/2022   Tobacco use disorder 04/26/2022   Vitamin D deficiency 10/16/2019   Depression 12/06/2018   Type 2 diabetes with complication (HCC) 12/06/2018   History of seizure 10/17/2016   Personal history of transient ischemic attack (TIA), and cerebral infarction without residual deficits 02/07/2016   SAH (subarachnoid hemorrhage) (HCC) 09/14/2015   Type 2 diabetes mellitus with hyperglycemia (HCC) 09/14/2015   Dyslipidemia  03/31/2015   Rhabdomyolysis 10/03/2014   Pharyngitis 10/03/2014   Essential hypertension 04/17/2014   1. OSA (obstructive sleep apnea) (Primary) The patient does tolerate PAP and reports  benefit from PAP use. His apneas are improving. He still has a high leak. He is using the low end of his pressure so we will reduce his range to 8-12.  The patient was reminded how to clean equipment and advised to replace supplies routinley. The patient was also counselled on weight loss. The compliance is fair. The AHI is 6.6.   OSA on cpap- will change to apap 8-12. Work on compliance. CPAP continues to be medically necessary to treat this patient's OSA. F/u 57m  2. CPAP use counseling CPAP Counseling: had a lengthy discussion with the patient regarding the importance of PAP therapy in management of the sleep apnea. Patient appears to understand the risk factor reduction and also understands the risks associated with untreated sleep apnea. Patient will try to make a good faith effort to remain compliant with therapy. Also instructed the patient on proper cleaning of the device including the water must be changed daily if possible and use of distilled water is preferred. Patient understands that the machine should be regularly cleaned with appropriate recommended cleaning solutions that do not damage the PAP machine for example given white vinegar and water rinses. Other methods such as ozone treatment may not be as good as these simple methods to achieve cleaning.   3. Essential hypertension Elevated reading discussed with patient. He took his medication only a short time ago. Monitor at home and f/u with provider     General Counseling: I have discussed the findings of the evaluation and examination with Rodney Hess.  I have also discussed any further diagnostic evaluation thatmay be needed or ordered today. Rodney Hess verbalizes understanding of the findings of todays visit. We also reviewed his  medications today and  discussed drug interactions and side effects including but not limited excessive drowsiness and altered mental states. We also discussed that there is always a risk not just to him but also people around him. he has been encouraged to call the office with any questions or concerns that should arise related to todays visit.  No orders of the defined types were placed in this encounter.       I have personally obtained a history, examined the patient, evaluated laboratory and imaging results, formulated the assessment and plan and placed orders. This patient was seen today by Emmaline Kluver, PA-C in collaboration with Dr. Freda Munro.   Yevonne Pax, MD [facility] Diplomate ABMS Pulmonary Critical Care Medicine and Sleep Medicine

## 2023-03-05 ENCOUNTER — Ambulatory Visit: Payer: Medicare Other | Admitting: Internal Medicine

## 2023-03-05 VITALS — BP 167/104 | HR 79 | Resp 20 | Ht 69.0 in | Wt 258.3 lb

## 2023-03-05 DIAGNOSIS — I10 Essential (primary) hypertension: Secondary | ICD-10-CM

## 2023-03-05 DIAGNOSIS — G4733 Obstructive sleep apnea (adult) (pediatric): Secondary | ICD-10-CM

## 2023-03-05 DIAGNOSIS — Z7189 Other specified counseling: Secondary | ICD-10-CM

## 2023-03-05 NOTE — Patient Instructions (Signed)

## 2023-06-29 NOTE — Progress Notes (Signed)
 [facility] [address] [place], [place] [address]  Pulmonary Sleep Medicine   Office Visit Note  Patient Name: Rodney LEAF Sr. DOB: May 23, 1970 MRN [id number]    Chief Complaint: Obstructive Sleep Apnea visit  Brief History:  Brayan is seen today for a follow up visit for APAP@ 8-12 cmH2O. The patient has a 1.5 year history of sleep apnea. Patient is not using PAP nightly.  The patient feels rested after sleeping with PAP.  The patient reports benefiting from PAP use. Reported sleepiness is improved and the Epworth Sleepiness Score is 3 out of 24. The patient does not take naps. The patient complains of the following: none. The compliance download shows 44% compliance with an average use time of 4 hours 7 minutes. The AHI is 3.5.  The patient does not complain of limb movements disrupting sleep. The patient continues to require PAP therapy in order to eliminate sleep apnea.   ROS  General: (-) fever, (-) chills, (-) night sweat Nose and Sinuses: (-) nasal stuffiness or itchiness, (-) postnasal drip, (-) nosebleeds, (-) sinus trouble. Mouth and Throat: (-) sore throat, (-) hoarseness. Neck: (-) swollen glands, (-) enlarged thyroid, (-) neck pain. Respiratory: - cough, - shortness of breath, - wheezing. Neurologic: - numbness, - tingling. Psychiatric: - anxiety, - depression   Current Medication: Outpatient Encounter Medications as of 07/02/2023  Medication Sig   amLODipine  (NORVASC ) 2.5 MG tablet Take 1 tablet by mouth daily.   aspirin  EC 81 MG tablet Take 1 tablet by mouth daily.   atorvastatin (LIPITOR) 40 MG tablet Take 1 tablet by mouth every evening.   hydrochlorothiazide (HYDRODIURIL) 25 MG tablet Take 1 tablet by mouth daily.   labetalol (NORMODYNE) 200 MG tablet Take by mouth.   linagliptin (TRADJENTA) 5 MG TABS tablet Take 1 tablet by mouth daily.   losartan (COZAAR) 100 MG tablet Take 1 tablet by mouth daily.   metFORMIN (GLUCOPHAGE) 500 MG tablet  Take by mouth.   mirtazapine (REMERON) 30 MG tablet Take 1 tablet by mouth at bedtime.   mupirocin  ointment (BACTROBAN ) 2 % Apply 1 Application topically 2 (two) times daily.   Omega-3 Fatty Acids (FISH OIL) 1000 MG CAPS Take by mouth.   PARoxetine (PAXIL) 30 MG tablet Take 30 mg by mouth daily.   potassium chloride SA (KLOR-CON M) 20 MEQ tablet Take 1 tablet by mouth daily.   prednisoLONE acetate (PRED FORTE) 1 % ophthalmic suspension Administer 1 drop to the right eye in the morning.   traZODone (DESYREL) 50 MG tablet Take by mouth.   No facility-administered encounter medications on file as of 07/02/2023.    Surgical History: Past Surgical History:  Procedure Laterality Date   THROAT SURGERY      Medical History: Past Medical History:  Diagnosis Date   Hypertension    Obesity    OSA (obstructive sleep apnea)     Family History: Non contributory to the present illness  Social History: Social History   Socioeconomic History   Marital status: Divorced    Spouse name: Not on file   Number of children: Not on file   Years of education: Not on file   Highest education level: Not on file  Occupational History   Not on file  Tobacco Use   Smoking status: Every Day    Current packs/day: 0.00    Types: Cigarettes    Last attempt to quit: 10/01/2014    Years since quitting: 8.7   Smokeless tobacco: Never  Vaping Use   Vaping status: Never Used  Substance and Sexual Activity   Alcohol use: Yes    Alcohol/week: 0.0 standard drinks of alcohol    Comment: daily, mostly beer   Drug use: No   Sexual activity: Not on file  Other Topics Concern   Not on file  Social History Narrative   Lives at home with family.   Social Drivers of Corporate investment banker Strain: Low Risk  (06/26/2023)   Received from [facility]   Overall Financial Resource Strain (CARDIA)    How hard is it for you to pay for the very basics like food, housing, medical care, and heating?: Not  very hard  Food Insecurity: No Food Insecurity (06/26/2023)   Received from [facility]   Hunger Vital Sign    Within the past 12 months, you worried that your food would run out before you got the money to buy more.: Never true    Within the past 12 months, the food you bought just didn't last and you didn't have money to get more.: Never true  Transportation Needs: No Transportation Needs (06/26/2023)   Received from [facility] - Transportation    In the past 12 months, has lack of transportation kept you from medical appointments or from getting medications?: No    In the past 12 months, has lack of transportation kept you from meetings, work, or from getting things needed for daily living?: No  Physical Activity: Not on file  Stress: Not on file  Social Connections: Not on file  Intimate Partner Violence: Not At Risk (04/26/2022)   Received from [facility]   Humiliation, Afraid, Rape, and Kick questionnaire    Within the last year, have you been afraid of your partner or ex-partner?: No    Within the last year, have you been humiliated or emotionally abused in other ways by your partner or ex-partner?: No    Within the last year, have you been kicked, hit, slapped, or otherwise physically hurt by your partner or ex-partner?: No    Within the last year, have you been raped or forced to have any kind of sexual activity by your partner or ex-partner?: No    Vital Signs: Blood pressure (!) 137/91, pulse 90, resp. rate 16, height 5' 9 (1.753 m), weight 266 lb (120.7 kg), SpO2 96%. Body mass index is 39.28 kg/m.    Examination: General Appearance: The patient is well-developed, well-nourished, and in no distress. Neck Circumference: 47 cm Skin: Gross inspection of skin unremarkable. Head: normocephalic, no gross deformities. Eyes: no gross deformities noted. ENT: ears appear grossly normal Neurologic: Alert and oriented. No involuntary movements.  STOP BANG  RISK ASSESSMENT S (snore) Have you been told that you snore?     NO   T (tired) Are you often tired, fatigued, or sleepy during the day?   NO  O (obstruction) Do you stop breathing, choke, or gasp during sleep? NO   P (pressure) Do you have or are you being treated for high blood pressure? YES   B (BMI) Is your body index greater than 35 kg/m? YES   A (age) Are you 1 years old or older? YES   N (neck) Do you have a neck circumference greater than 16 inches?   YES   G (gender) Are you a male? YES   TOTAL STOP/BANG "YES" ANSWERS 5       A STOP-Bang score  of 2 or less is considered low risk, and a score of 5 or more is high risk for having either moderate or severe OSA. For people who score 3 or 4, doctors may need to perform further assessment to determine how likely they are to have OSA.         EPWORTH SLEEPINESS SCALE:  Scale:  (0)= no chance of dozing; (1)= slight chance of dozing; (2)= moderate chance of dozing; (3)= high chance of dozing  Chance  Situtation    Sitting and reading: 0    Watching TV: 1    Sitting Inactive in public: 0    As a passenger in car: 0      Lying down to rest: 2    Sitting and talking: 0    Sitting quielty after lunch: 0    In a car, stopped in traffic: 0   TOTAL SCORE:   3 out of 24    SLEEP STUDIES:  PSG - 12/24/21 - AHI 36.8/hr   REM AHI 95.1/hr  min Sp02 82% Titration - 01/22/22 - APAP 10-20cmH20 Titration - 09/13/22 - APAP 10-20cmH20   CPAP COMPLIANCE DATA:  Date Range: 03/01/2023-06/28/2023  Average Daily Use: 4 hours 7 minutes  Median Use: 3 hours 47 minutes  Compliance for > 4 Hours: 44%  AHI: 3.5 respiratory events per hour  Days Used: 118/120 days  Mask Leak: 105.7  95th Percentile Pressure: 9.3         LABS: No results found for this or any previous visit (from the past 2160 hours).  Radiology: No results found.  No results found.  No results found.    Assessment and Plan: Patient  Active Problem List   Diagnosis Date Noted   OSA (obstructive sleep apnea) 07/31/2022   CPAP use counseling 07/31/2022   Major depressive disorder, recurrent, in full remission (HCC) 04/26/2022   Tobacco use disorder 04/26/2022   Vitamin D deficiency 10/16/2019   Depression 12/06/2018   Type 2 diabetes with complication (HCC) 12/06/2018   History of seizure 10/17/2016   Personal history of transient ischemic attack (TIA), and cerebral infarction without residual deficits 02/07/2016   SAH (subarachnoid hemorrhage) (HCC) 09/14/2015   Type 2 diabetes mellitus with hyperglycemia (HCC) 09/14/2015   Dyslipidemia 03/31/2015   Rhabdomyolysis 10/03/2014   Pharyngitis 10/03/2014   Essential hypertension 04/17/2014   1. OSA (obstructive sleep apnea) (Primary) The patient does tolerate PAP and reports  benefit from PAP use. The patient was reminded how to clean equipment and advised to replace supplies routinely. The patient was also counselled on weight loss. The compliance is poor. The AHI is 3.5.   OSA on cpap- controlled. Continue with excellent compliance with pap. CPAP continues to be medically necessary to treat this patient's OSA. F/u one year.    2. CPAP use counseling CPAP Counseling: had a lengthy discussion with the patient regarding the importance of PAP therapy in management of the sleep apnea. Patient appears to understand the risk factor reduction and also understands the risks associated with untreated sleep apnea. Patient will try to make a good faith effort to remain compliant with therapy. Also instructed the patient on proper cleaning of the device including the water must be changed daily if possible and use of distilled water is preferred. Patient understands that the machine should be regularly cleaned with appropriate recommended cleaning solutions that do not damage the PAP machine for example given white vinegar and water rinses. Other methods such as ozone treatment  may not  be as good as these simple methods to achieve cleaning.   3. Essential hypertension Controlled with amlodipine , losartan.      General Counseling: I have discussed the findings of the evaluation and examination with Sherolyn Dixon.  I have also discussed any further diagnostic evaluation thatmay be needed or ordered today. Giuliano verbalizes understanding of the findings of todays visit. We also reviewed his medications today and discussed drug interactions and side effects including but not limited excessive drowsiness and altered mental states. We also discussed that there is always a risk not just to him but also people around him. he has been encouraged to call the office with any questions or concerns that should arise related to todays visit.  No orders of the defined types were placed in this encounter.       I have personally obtained a history, examined the patient, evaluated laboratory and imaging results, formulated the assessment and plan and placed orders. This patient was seen today by Louann Rous, PA-C in collaboration with Dr. Cam Cava.   Cordie Deters, MD [facility] Diplomate ABMS Pulmonary Critical Care Medicine and Sleep Medicine

## 2023-07-02 ENCOUNTER — Ambulatory Visit (INDEPENDENT_AMBULATORY_CARE_PROVIDER_SITE_OTHER): Admitting: Internal Medicine

## 2023-07-02 VITALS — BP 137/91 | HR 90 | Resp 16 | Ht 69.0 in | Wt 266.0 lb

## 2023-07-02 DIAGNOSIS — I10 Essential (primary) hypertension: Secondary | ICD-10-CM | POA: Diagnosis not present

## 2023-07-02 DIAGNOSIS — G4733 Obstructive sleep apnea (adult) (pediatric): Secondary | ICD-10-CM

## 2023-07-02 DIAGNOSIS — Z7189 Other specified counseling: Secondary | ICD-10-CM

## 2023-07-02 NOTE — Patient Instructions (Signed)
